# Patient Record
Sex: Female | Born: 1955
Health system: Southern US, Community
[De-identification: ages and names within clinical notes are randomized; demographics above are authoritative.]

## PROBLEM LIST (undated history)

## (undated) DIAGNOSIS — R0683 Snoring: Secondary | ICD-10-CM

## (undated) DIAGNOSIS — E785 Hyperlipidemia, unspecified: Secondary | ICD-10-CM

## (undated) DIAGNOSIS — Z9889 Other specified postprocedural states: Secondary | ICD-10-CM

## (undated) DIAGNOSIS — J189 Pneumonia, unspecified organism: Secondary | ICD-10-CM

## (undated) DIAGNOSIS — T7840XA Allergy, unspecified, initial encounter: Secondary | ICD-10-CM

## (undated) DIAGNOSIS — I10 Essential (primary) hypertension: Secondary | ICD-10-CM

## (undated) DIAGNOSIS — K219 Gastro-esophageal reflux disease without esophagitis: Secondary | ICD-10-CM

## (undated) HISTORY — DX: Hyperlipidemia, unspecified: E78.5

## (undated) HISTORY — DX: Gastro-esophageal reflux disease without esophagitis: K21.9

## (undated) HISTORY — DX: Allergy, unspecified, initial encounter: T78.40XA

## (undated) HISTORY — PX: AUGMENTATION MAMMAPLASTY: SUR837

## (undated) HISTORY — DX: Other specified postprocedural states: Z98.890

## (undated) HISTORY — PX: BREAST SURGERY: SHX581

## (undated) HISTORY — PX: COLONOSCOPY: SHX174

---

## 1978-02-08 HISTORY — PX: NASAL SINUS SURGERY: SHX719

## 1997-02-08 HISTORY — PX: ABDOMINAL HYSTERECTOMY: SHX81

## 1997-04-06 ENCOUNTER — Ambulatory Visit (HOSPITAL_COMMUNITY): Admission: RE | Admit: 1997-04-06 | Discharge: 1997-04-06 | Payer: Self-pay | Admitting: Obstetrics & Gynecology

## 1999-08-18 ENCOUNTER — Other Ambulatory Visit: Admission: RE | Admit: 1999-08-18 | Discharge: 1999-08-18 | Payer: Self-pay | Admitting: Obstetrics & Gynecology

## 2000-10-03 ENCOUNTER — Other Ambulatory Visit: Admission: RE | Admit: 2000-10-03 | Discharge: 2000-10-03 | Payer: Self-pay | Admitting: Obstetrics & Gynecology

## 2001-10-04 ENCOUNTER — Other Ambulatory Visit: Admission: RE | Admit: 2001-10-04 | Discharge: 2001-10-04 | Payer: Self-pay | Admitting: Obstetrics & Gynecology

## 2002-06-21 ENCOUNTER — Ambulatory Visit (HOSPITAL_COMMUNITY): Admission: RE | Admit: 2002-06-21 | Discharge: 2002-06-21 | Payer: Self-pay | Admitting: Internal Medicine

## 2002-07-24 ENCOUNTER — Other Ambulatory Visit: Admission: RE | Admit: 2002-07-24 | Discharge: 2002-07-24 | Payer: Self-pay | Admitting: Obstetrics & Gynecology

## 2002-11-06 ENCOUNTER — Encounter: Payer: Self-pay | Admitting: Obstetrics & Gynecology

## 2002-11-06 ENCOUNTER — Ambulatory Visit (HOSPITAL_COMMUNITY): Admission: RE | Admit: 2002-11-06 | Discharge: 2002-11-06 | Payer: Self-pay | Admitting: Obstetrics & Gynecology

## 2003-07-05 ENCOUNTER — Other Ambulatory Visit: Admission: RE | Admit: 2003-07-05 | Discharge: 2003-07-05 | Payer: Self-pay | Admitting: Obstetrics & Gynecology

## 2004-07-14 ENCOUNTER — Other Ambulatory Visit: Admission: RE | Admit: 2004-07-14 | Discharge: 2004-07-14 | Payer: Self-pay | Admitting: Obstetrics & Gynecology

## 2010-10-14 ENCOUNTER — Encounter: Payer: Self-pay | Admitting: Family Medicine

## 2010-10-14 ENCOUNTER — Ambulatory Visit (INDEPENDENT_AMBULATORY_CARE_PROVIDER_SITE_OTHER): Payer: BC Managed Care – PPO | Admitting: Family Medicine

## 2010-10-14 DIAGNOSIS — E785 Hyperlipidemia, unspecified: Secondary | ICD-10-CM

## 2010-10-14 DIAGNOSIS — E119 Type 2 diabetes mellitus without complications: Secondary | ICD-10-CM

## 2010-10-14 DIAGNOSIS — I10 Essential (primary) hypertension: Secondary | ICD-10-CM

## 2010-10-14 LAB — BASIC METABOLIC PANEL
BUN: 18 mg/dL (ref 6–23)
Calcium: 9.5 mg/dL (ref 8.4–10.5)
Chloride: 104 mEq/L (ref 96–112)
Glucose, Bld: 106 mg/dL — ABNORMAL HIGH (ref 70–99)
Potassium: 4.8 mEq/L (ref 3.5–5.1)

## 2010-10-14 NOTE — Assessment & Plan Note (Signed)
BP is excellently controlled- on ACE for both BP and renal protection due to DM.  Asymptomatic.  No changes at this time.

## 2010-10-14 NOTE — Assessment & Plan Note (Signed)
Pt admits to not following ADA diet.  Is exercising.  Taking Metformin as directed.  Due for A1C- adjust meds prn.  Pt expressed understanding and is in agreement w/ plan.

## 2010-10-14 NOTE — Patient Instructions (Signed)
Follow up in 3 months to recheck diabetes and cholesterol We'll notify you of your lab results and make any changes if needed Call with any questions or concerns Welcome!  We're glad to have you!

## 2010-10-14 NOTE — Progress Notes (Signed)
  Subjective:    Patient ID: Sherry Gallagher, female    DOB: 12-Oct-1955, 55 y.o.   MRN: [id number]  HPI New to establish.  Previous MD- [place], [place]  DM- chronic problem, dx'd 3 yrs ago.  on Metformin.  Lost 50 lbs when dx'd but gained 33 lbs back.  Last A1C was 5/12- 6.1.  'i love carbs'.  Denies symptomatic lows.  Not currently following ADA diet but is exercising regularly.  HTN- chronic problem for pt, started at time of DM.  on benazepril- HCTZ.  Denies CP, SOB, HAs, visual changes, edema.  Cholesterol- chronic problem for pt, on Pravachol 40mg .  Last labs, 5/12- LDL 117, HDL 53.  Denies abd pain, N/V, myalgias.  Concerns about husband who will be establishing in October.  Wife worries about possible dementia onset.  Has dx of ADD, has never taken meds.  Has increased aggravation and frustration- some difficulty w/ memory.  Review of Systems For ROS see HPI     Objective:   Physical Exam  Constitutional: She is oriented to person, place, and time. She appears well-developed and well-nourished. No distress.  HENT:  Head: Normocephalic and atraumatic.  Eyes: Conjunctivae and EOM are normal. Pupils are equal, round, and reactive to light.  Neck: Normal range of motion. Neck supple. No thyromegaly present.  Cardiovascular: Normal rate, regular rhythm, normal heart sounds and intact distal pulses.   No murmur heard. Pulmonary/Chest: Effort normal and breath sounds normal. No respiratory distress.  Abdominal: Soft. She exhibits no distension. There is no tenderness.  Musculoskeletal: She exhibits no edema.  Lymphadenopathy:    She has no cervical adenopathy.  Neurological: She is alert and oriented to person, place, and time.  Skin: Skin is warm and dry.  Psychiatric: She has a normal mood and affect. Her behavior is normal.          Assessment & Plan:

## 2010-10-14 NOTE — Assessment & Plan Note (Signed)
LDL goal is <70 due to DM.  Check labs and adjust meds prn.

## 2010-10-15 ENCOUNTER — Telehealth: Payer: Self-pay

## 2010-10-15 NOTE — Telephone Encounter (Signed)
Message copied by Beverely Low on Thu Oct 15, 2010  8:47 AM ------      Message from: Sheliah Hatch      Created: Wed Oct 14, 2010  1:45 PM       A1C is 6.5 (this is higher than 6.1 in May).  No need for med changes but should try and follow low carb diet and get regular exercise.

## 2010-10-15 NOTE — Telephone Encounter (Signed)
Pt aware.

## 2010-10-20 ENCOUNTER — Ambulatory Visit: Payer: Self-pay | Admitting: Family Medicine

## 2010-11-19 ENCOUNTER — Ambulatory Visit: Payer: Self-pay | Admitting: Family Medicine

## 2010-12-23 ENCOUNTER — Other Ambulatory Visit: Payer: Self-pay | Admitting: Family Medicine

## 2010-12-23 MED ORDER — METFORMIN HCL ER (MOD) 500 MG PO TB24: 1000.0000 mg | ORAL_TABLET | Freq: Every day | ORAL | Status: DC

## 2010-12-23 NOTE — Telephone Encounter (Signed)
Pt aware Of Dr Beverely Low comments.

## 2010-12-23 NOTE — Telephone Encounter (Signed)
Last OV 10-14-10

## 2010-12-23 NOTE — Telephone Encounter (Signed)
rx sent to pharmacy by e-script  

## 2010-12-23 NOTE — Telephone Encounter (Signed)
Pt is due for f/u appt in December- will hold off on making med changes until we see what next A1C is.  Should continue to follow ADA diet and try and get regular exercise.

## 2011-01-19 ENCOUNTER — Other Ambulatory Visit: Payer: Self-pay | Admitting: *Deleted

## 2011-01-19 MED ORDER — GLUCOSE BLOOD VI STRP: ORAL_STRIP | Status: DC

## 2011-01-19 NOTE — Telephone Encounter (Signed)
rx sent to pharmacy by e-script  

## 2011-01-26 ENCOUNTER — Ambulatory Visit (INDEPENDENT_AMBULATORY_CARE_PROVIDER_SITE_OTHER): Payer: BC Managed Care – PPO | Admitting: Internal Medicine

## 2011-01-26 DIAGNOSIS — J4540 Moderate persistent asthma, uncomplicated: Secondary | ICD-10-CM | POA: Insufficient documentation

## 2011-01-26 DIAGNOSIS — J45909 Unspecified asthma, uncomplicated: Secondary | ICD-10-CM

## 2011-01-26 DIAGNOSIS — E119 Type 2 diabetes mellitus without complications: Secondary | ICD-10-CM

## 2011-01-26 LAB — GLUCOSE, POCT (MANUAL RESULT ENTRY): POC Glucose: 373

## 2011-01-26 MED ORDER — HYDROCODONE-HOMATROPINE 5-1.5 MG/5ML PO SYRP: 5.0000 mL | ORAL_SOLUTION | Freq: Four times a day (QID) | ORAL | Status: AC | PRN

## 2011-01-26 MED ORDER — DOXYCYCLINE HYCLATE 100 MG PO TABS: 100.0000 mg | ORAL_TABLET | Freq: Two times a day (BID) | ORAL | Status: DC

## 2011-01-26 NOTE — Patient Instructions (Signed)
Get the XR in the morning Stop prednisone Rest, fluids , tylenol For cough, take Mucinex DM twice a day as needed  If cough continue, use hydrocodone (liquid) For wheezing: albuterol 4 times a day x 2 days, then as needed Take the antibiotic as prescribed ----> doxycycline Call if no better in few days Call anytime if the symptoms are severe

## 2011-01-26 NOTE — Progress Notes (Signed)
  Subjective:    Patient ID: Sherry Gallagher, female    DOB: [birth year], 55 y.o.   MRN: [id number]  HPI Sick x 1 week: Cough, wheezing, in general not feeling well; started prednisone 40x2 days, then 30x2 days, now at 20 mg a day---> that helped the wheezing to some extent. Started to have nocturnal fever 5 days ago, still coughing, still using his albuterol inh and nebulizer  Past Medical History  Diagnosis Date  . Diabetes mellitus   . Asthma   . GERD (gastroesophageal reflux disease)   . Allergy      Review of Systems No  N V D no rash No CP SOB No ST Has not checked her CBGs since she started steroids     Objective:   Physical Exam  Constitutional: She is oriented to person, place, and time. She appears well-developed and well-nourished.  HENT:  Head: Normocephalic and atraumatic.  Right Ear: External ear normal.  Left Ear: External ear normal.  Nose: Nose normal.  Mouth/Throat: No oropharyngeal exudate.  Cardiovascular:       HR ~ 100, no murmur   Pulmonary/Chest:       No increase WOB, few ronchi, end exp wheezing  Musculoskeletal: She exhibits no edema.  Neurological: She is alert and oriented to person, place, and time.       Assessment & Plan:

## 2011-01-26 NOTE — Assessment & Plan Note (Signed)
Pt w/ DM and asthma going through a exhacerbation, CBG today 373 (on prednisone 20 mg a day at this point) Plan: D/c steroids CXR abx See instructions  If prednisone needed in the next few days, consider Rx it along w/ amaryl

## 2011-01-27 ENCOUNTER — Telehealth: Payer: Self-pay | Admitting: *Deleted

## 2011-01-27 ENCOUNTER — Ambulatory Visit (INDEPENDENT_AMBULATORY_CARE_PROVIDER_SITE_OTHER)
Admission: RE | Admit: 2011-01-27 | Discharge: 2011-01-27 | Disposition: A | Discharge status: Home / Self Care | Payer: BC Managed Care – PPO | Source: Ambulatory Visit | Attending: Internal Medicine | Admitting: Internal Medicine

## 2011-01-27 ENCOUNTER — Encounter: Payer: Self-pay | Admitting: Internal Medicine

## 2011-01-27 DIAGNOSIS — J45909 Unspecified asthma, uncomplicated: Secondary | ICD-10-CM

## 2011-01-27 MED ORDER — MOXIFLOXACIN HCL 400 MG PO TABS: 400.0000 mg | ORAL_TABLET | Freq: Every day | ORAL | Status: AC

## 2011-01-27 NOTE — Telephone Encounter (Signed)
Message copied by Verdene Rio on Wed Jan 27, 2011  9:24 AM ------      Message from: Willow Ora E      Created: Wed Jan 27, 2011  7:00 AM       Open a phone note      Check on her, asthma better? CBGs better ?

## 2011-01-27 NOTE — Telephone Encounter (Signed)
Advise patient: I just saw the x-ray report, she does have pneumonia. Doxycycline is a good antibiotic but I prefer to switch to  Avelox 400 mg one daily for one week. I sent a new prescription, recommend to start Avelox today. ER if symptoms severe, also please followup with PCP in 3 weeks

## 2011-01-27 NOTE — Telephone Encounter (Signed)
Pt states that BS was 197 at 4 am and 217 at 8 am. Pt indicated that asthma is fine no SOB or difficulty with breathing Pt notes that she is on her way to get x-ray done.

## 2011-01-27 NOTE — Telephone Encounter (Signed)
Discuss with patient  

## 2011-01-28 ENCOUNTER — Telehealth: Payer: Self-pay | Admitting: *Deleted

## 2011-01-28 NOTE — Telephone Encounter (Signed)
Pt called to report that BS are still running high. Fasting today BS was 173 and having been running in low 200 all day. Pt denies any symptoms. Pt is just concern because BS are still high and are not return to her normal range of fasting at 130.Marland KitchenPlease advise

## 2011-01-29 ENCOUNTER — Encounter: Payer: Self-pay | Admitting: *Deleted

## 2011-01-29 NOTE — Telephone Encounter (Signed)
Patients with diabetes and infections such as pneumonia tend to run higher sugars. For a few days, in addition to her to metformin in the morning, she needs to take one  with dinner. Call with CBGs next week. ER it CBGs more than 250 consistently, also  watch diet

## 2011-01-29 NOTE — Telephone Encounter (Signed)
ok 

## 2011-01-29 NOTE — Telephone Encounter (Signed)
Pt needs work excuse for following dates 01-25-11 until 01-28-11 and Pt to return to work on 01-29-11. Pt would like letter mailed to her. .Please advise Ok to provide work excuse.   Discuss with patient will call with reading next weeks and ED if still elevated consistently.

## 2011-01-29 NOTE — Telephone Encounter (Signed)
Left message to call office

## 2011-01-29 NOTE — Telephone Encounter (Signed)
Letter Mail

## 2011-02-03 ENCOUNTER — Telehealth: Payer: Self-pay | Admitting: *Deleted

## 2011-02-03 NOTE — Telephone Encounter (Signed)
Pt took last ABX for pneumonia [seen on 12.18.12] and is still feeling like "she's not bouncing back". Upon speaking w/Pt reported that she has not used her Hycodan for cough--informed pt to use the Hycodan for cough, and continue w/Advair & albuterol nebulizer for bronchial & respiratory issues while recovering. Pt will call if symptoms do not improve in 2-3 days and/or if fever, trouble breathing begin.

## 2011-02-12 ENCOUNTER — Other Ambulatory Visit (INDEPENDENT_AMBULATORY_CARE_PROVIDER_SITE_OTHER): Payer: BC Managed Care – PPO

## 2011-02-12 DIAGNOSIS — I10 Essential (primary) hypertension: Secondary | ICD-10-CM

## 2011-02-12 DIAGNOSIS — E785 Hyperlipidemia, unspecified: Secondary | ICD-10-CM

## 2011-02-12 DIAGNOSIS — E119 Type 2 diabetes mellitus without complications: Secondary | ICD-10-CM

## 2011-02-12 LAB — HEMOGLOBIN A1C: Hgb A1c MFr Bld: 7.4 % — ABNORMAL HIGH (ref 4.6–6.5)

## 2011-02-12 LAB — LIPID PANEL
Cholesterol: 198 mg/dL (ref 0–200)
VLDL: 31.6 mg/dL (ref 0.0–40.0)

## 2011-02-12 LAB — BASIC METABOLIC PANEL
BUN: 19 mg/dL (ref 6–23)
Chloride: 105 mEq/L (ref 96–112)
GFR: 71.71 mL/min (ref >60.00)
Glucose, Bld: 156 mg/dL — ABNORMAL HIGH (ref 70–99)
Potassium: 4.9 mEq/L (ref 3.5–5.1)
Sodium: 140 mEq/L (ref 135–145)

## 2011-02-15 ENCOUNTER — Other Ambulatory Visit: Payer: Self-pay

## 2011-02-15 MED ORDER — PRAVASTATIN SODIUM 40 MG PO TABS: 40.0000 mg | ORAL_TABLET | Freq: Every day | ORAL | Status: DC

## 2011-02-15 NOTE — Telephone Encounter (Signed)
rx sent to pharmacy by e-script  

## 2011-02-15 NOTE — Telephone Encounter (Signed)
Ok for 6 months of meds

## 2011-02-15 NOTE — Telephone Encounter (Signed)
Pt is requesting a refill on Pravastin 40 mg. Pls send to [facility] on [address].

## 2011-02-18 ENCOUNTER — Encounter: Payer: Self-pay | Admitting: Family Medicine

## 2011-02-18 ENCOUNTER — Ambulatory Visit (INDEPENDENT_AMBULATORY_CARE_PROVIDER_SITE_OTHER): Payer: BC Managed Care – PPO | Admitting: Family Medicine

## 2011-02-18 DIAGNOSIS — E119 Type 2 diabetes mellitus without complications: Secondary | ICD-10-CM

## 2011-02-18 MED ORDER — TRETINOIN 0.1 % EX CREA: TOPICAL_CREAM | Freq: Every day | CUTANEOUS | Status: AC

## 2011-02-18 MED ORDER — METFORMIN HCL ER (OSM) 1000 MG PO TB24: 1000.0000 mg | ORAL_TABLET | Freq: Every day | ORAL | Status: DC

## 2011-02-18 NOTE — Patient Instructions (Signed)
Call me in 1 week and let me know how the sugars are doing Continue to check your fasting and your evening sugars If the sugars are still high after 1 week- start the Januvia daily We'll figure out follow up once we talk in a week Don't beat yourself up! Call with any questions or concerns Hang in there!!!

## 2011-02-18 NOTE — Progress Notes (Signed)
  Subjective:    Patient ID: Sherry Gallagher, female    DOB: 11-29-1955, 56 y.o.   MRN: [id number]  HPI DM- recent A1C shows worsening A1C control (7.4) started steroids that previous MD had given her in [facility] and did not realize that it would impact CBGs.  CBG was elevated to 300s here in December.  Reports CBGs have slowly come down under 200.  Fasting CBGs remain 150-160 where previously it was consistently in 120-130s.  Denies CP, SOB, HAs, visual changes, edema.   Review of Systems For ROS see HPI     Objective:   Physical Exam  Constitutional: She is oriented to person, place, and time. She appears well-developed and well-nourished. No distress.  HENT:  Head: Normocephalic and atraumatic.  Eyes: Conjunctivae and EOM are normal. Pupils are equal, round, and reactive to light.  Neck: Normal range of motion. Neck supple. No thyromegaly present.  Cardiovascular: Normal rate, regular rhythm, normal heart sounds and intact distal pulses.   No murmur heard. Pulmonary/Chest: Effort normal and breath sounds normal. No respiratory distress.  Abdominal: Soft. She exhibits no distension. There is no tenderness.  Musculoskeletal: She exhibits no edema.  Lymphadenopathy:    She has no cervical adenopathy.  Neurological: She is alert and oriented to person, place, and time.  Skin: Skin is warm and dry.  Psychiatric: She has a normal mood and affect. Her behavior is normal.          Assessment & Plan:

## 2011-02-18 NOTE — Assessment & Plan Note (Signed)
Deteriorated.  Likely due to pt's recent illness and prednisone use.  Pt feels pharmacy switching her Metformin to Glumetza has effected her level on control.  Will switch med.  If sugars don't improve w/ changing med, pt to add Januvia after 1 week.  Will follow closely.

## 2011-02-22 ENCOUNTER — Telehealth: Payer: Self-pay | Admitting: *Deleted

## 2011-02-22 ENCOUNTER — Emergency Department (INDEPENDENT_AMBULATORY_CARE_PROVIDER_SITE_OTHER): Payer: BC Managed Care – PPO

## 2011-02-22 ENCOUNTER — Encounter (HOSPITAL_BASED_OUTPATIENT_CLINIC_OR_DEPARTMENT_OTHER): Payer: Self-pay | Admitting: Family Medicine

## 2011-02-22 ENCOUNTER — Other Ambulatory Visit: Payer: Self-pay

## 2011-02-22 ENCOUNTER — Emergency Department (HOSPITAL_BASED_OUTPATIENT_CLINIC_OR_DEPARTMENT_OTHER)
Admission: EM | Admit: 2011-02-22 | Discharge: 2011-02-22 | Disposition: A | Discharge status: Home / Self Care | Payer: BC Managed Care – PPO | Attending: Emergency Medicine | Admitting: Emergency Medicine

## 2011-02-22 DIAGNOSIS — R079 Chest pain, unspecified: Secondary | ICD-10-CM | POA: Insufficient documentation

## 2011-02-22 DIAGNOSIS — J45909 Unspecified asthma, uncomplicated: Secondary | ICD-10-CM

## 2011-02-22 DIAGNOSIS — J45901 Unspecified asthma with (acute) exacerbation: Secondary | ICD-10-CM | POA: Insufficient documentation

## 2011-02-22 DIAGNOSIS — K219 Gastro-esophageal reflux disease without esophagitis: Secondary | ICD-10-CM | POA: Insufficient documentation

## 2011-02-22 DIAGNOSIS — J9819 Other pulmonary collapse: Secondary | ICD-10-CM

## 2011-02-22 DIAGNOSIS — R0602 Shortness of breath: Secondary | ICD-10-CM | POA: Insufficient documentation

## 2011-02-22 DIAGNOSIS — Z79899 Other long term (current) drug therapy: Secondary | ICD-10-CM | POA: Insufficient documentation

## 2011-02-22 DIAGNOSIS — E119 Type 2 diabetes mellitus without complications: Secondary | ICD-10-CM | POA: Insufficient documentation

## 2011-02-22 HISTORY — DX: Pneumonia, unspecified organism: J18.9

## 2011-02-22 LAB — CBC
Hemoglobin: 14.1 g/dL (ref 12.0–15.0)
MCH: 30.2 pg (ref 26.0–34.0)
MCV: 91.2 fL (ref 78.0–100.0)
Platelets: 366 10*3/uL (ref 150–400)
RBC: 4.67 MIL/uL (ref 3.87–5.11)

## 2011-02-22 LAB — BASIC METABOLIC PANEL
CO2: 25 mEq/L (ref 19–32)
Calcium: 10.3 mg/dL (ref 8.4–10.5)
Creatinine, Ser: 0.6 mg/dL (ref 0.50–1.10)
Glucose, Bld: 132 mg/dL — ABNORMAL HIGH (ref 70–99)

## 2011-02-22 MED ORDER — ALBUTEROL SULFATE HFA 108 (90 BASE) MCG/ACT IN AERS
2.0000 | INHALATION_SPRAY | Freq: Once | RESPIRATORY_TRACT | Status: AC
  Administered 2011-02-22: 2 via RESPIRATORY_TRACT
  Filled 2011-02-22: qty 6.7

## 2011-02-22 MED ORDER — IPRATROPIUM BROMIDE 0.02 % IN SOLN
0.5000 mg | Freq: Once | RESPIRATORY_TRACT | Status: AC
  Administered 2011-02-22: 0.5 mg via RESPIRATORY_TRACT
  Filled 2011-02-22: qty 2.5

## 2011-02-22 MED ORDER — ALBUTEROL SULFATE (5 MG/ML) 0.5% IN NEBU
5.0000 mg | INHALATION_SOLUTION | Freq: Once | RESPIRATORY_TRACT | Status: AC
  Administered 2011-02-22: 5 mg via RESPIRATORY_TRACT
  Filled 2011-02-22: qty 1

## 2011-02-22 MED ORDER — AEROCHAMBER PLUS W/MASK LARGE MISC
1.0000 | Freq: Once | Status: DC
  Filled 2011-02-22: qty 1

## 2011-02-22 MED ORDER — ASPIRIN 81 MG PO CHEW
324.0000 mg | CHEWABLE_TABLET | Freq: Once | ORAL | Status: AC
  Administered 2011-02-22: 324 mg via ORAL
  Filled 2011-02-22: qty 4

## 2011-02-22 NOTE — ED Provider Notes (Signed)
History  This chart was scribed for Sherry Numbers, MD by Bennett Scrape. This patient was seen in room MH12/MH12 and the patient's care was started at 3:23PM.  CSN: [id number]  Arrival date & time 02/22/11  1347   First MD Initiated Contact with Patient 02/22/11 1521      Chief Complaint  Patient presents with  . Shortness of Breath   Patient is a 56 y.o. female presenting with shortness of breath. The history is provided by the patient. No language interpreter was used.  Shortness of Breath  The current episode started 3 to 5 days ago. The onset was gradual. The problem occurs frequently. The problem has been unchanged. The symptoms are relieved by nothing. The symptoms are aggravated by nothing. Associated symptoms include shortness of breath. Pertinent negatives include no chest pain, no fever, no rhinorrhea, no sore throat, no cough and no wheezing. Her past medical history is significant for asthma.    Sherry Gallagher is a 56 y.o. female who presents to the Emergency Department complaining of 3 days of gradual onset, non-changing, intermittent SOB with associated chest discomfort described as mild soreness that started while she was on her computer at home. She describes her SOB as taking short breaths with an occasional deeper breath every few minutes. She denies any modifying factors and states that the symptoms are non-exertional. Pt has a h/o asthma and reports that she has been taking advair and albuterol with no improvement in symptoms. She denies being on hormones currently.  She denies fever, myalgias, and wheezing as associated symptoms. Pt states that she called her PCP today and was told to come to the ED for a scan. She states she has been able to exercise and do normal tasks. She reports that she took 9 days worth of prednisone between December 8th, 2012 and December 17th, 2012 and stopped taking her blood sugar medication. She states that she quit taking the prednisone after  a check-up with her PCP on December 17th, 2012. She reports that her CBG level was 363 and that she was diagnosed with pneumonia through a chest x-ray. She reports that her CBG has returned to normal between 120-140. She reports that it was 174 this morning. She has not gotten a flu shot this year.  She states that she has been experiencing a lot of stress lately from starting a new job. She denies smoking and alcohol use.  Patient has no history of PE or DVT and has been on no recent long trips. She's had a recent embolization either. She denies any calf swelling or tenderness.  Pt's PCP is Dr. Beverely Low.    Past Medical History  Diagnosis Date  . Diabetes mellitus   . Asthma   . GERD (gastroesophageal reflux disease)   . Allergy   . Pneumonia     Past Surgical History  Procedure Date  . Abdominal hysterectomy 1999    Family History  Problem Relation Age of Onset  . Alcohol abuse Father   . Cancer Maternal Grandmother     breast  . Cancer Paternal Grandmother     breast    History  Substance Use Topics  . Smoking status: Never Smoker   . Smokeless tobacco: Not on file  . Alcohol Use: No    OB History    Grav Para Term Preterm Abortions TAB SAB Ect Mult Living                  Review of  Systems  Constitutional: Negative for fever and chills.  HENT: Negative for sore throat, rhinorrhea and neck pain.   Eyes: Negative for itching.  Respiratory: Positive for shortness of breath. Negative for cough and wheezing.   Cardiovascular: Negative for chest pain.  Gastrointestinal: Negative for nausea, vomiting, abdominal pain and diarrhea.  Genitourinary: Negative for dysuria, urgency and hematuria.  Musculoskeletal: Negative for back pain.  Skin: Negative for rash.  Neurological: Negative for weakness and headaches.    Allergies  Penicillins  Home Medications   Current Outpatient Rx  Name Route Sig Dispense Refill  . ALBUTEROL SULFATE (2.5 MG/3ML) 0.083% IN NEBU  Nebulization Take 2.5 mg by nebulization daily as needed.      Marland Kitchen BENAZEPRIL-HYDROCHLOROTHIAZIDE 20-25 MG PO TABS Oral Take 0.5 tablets by mouth daily.      Marland Kitchen CETIRIZINE HCL 10 MG PO TABS Oral Take 10 mg by mouth daily. Every other day     . ESOMEPRAZOLE MAGNESIUM 40 MG PO CPDR Oral Take 40 mg by mouth. 1 tab every other day     . FLUTICASONE-SALMETEROL 250-50 MCG/DOSE IN AEPB Inhalation Inhale into the lungs every 12 (twelve) hours.      Marland Kitchen GLUCOSE BLOOD VI STRP  Use as instructed 100 each 12  . METFORMIN HCL ER (OSM) 1000 MG PO TB24 Oral Take 1 tablet (1,000 mg total) by mouth daily with breakfast. 90 tablet 3  . PRAVASTATIN SODIUM 40 MG PO TABS Oral Take 1 tablet (40 mg total) by mouth daily. 30 tablet 6  . TRETINOIN 0.1 % EX CREA Topical Apply topically at bedtime. 45 g 3    Triage Vitals: BP 142/79  Pulse 98  Temp(Src) 98 F (36.7 C) (Oral)  Resp 16  Ht 5' 6.25" (1.683 m)  Wt 233 lb (105.688 kg)  BMI 37.32 kg/m2  SpO2 100%  Physical Exam  Nursing note and vitals reviewed. Constitutional: She is oriented to person, place, and time. She appears well-developed and well-nourished.  HENT:  Head: Normocephalic and atraumatic.  Eyes: Conjunctivae and EOM are normal.  Neck: Normal range of motion. Neck supple.  Cardiovascular: Normal rate, regular rhythm and normal heart sounds.   Pulmonary/Chest: Effort normal. No respiratory distress.       Diminished throughout  Abdominal: Soft. There is no tenderness.  Musculoskeletal: Normal range of motion. She exhibits no edema.  Neurological: She is alert and oriented to person, place, and time. No cranial nerve deficit.  Skin: Skin is warm and dry. No rash noted.  Psychiatric: She has a normal mood and affect. Her behavior is normal.    ED Course  Procedures (including critical care time)  DIAGNOSTIC STUDIES: Oxygen Saturation is 99% on room air, normal by my interpretation.    COORDINATION OF CARE: 3:30PM-Discussed finger stick,  chest x-ray, EKG, CBC, troponin and breathing treatment with pt at bedside and pt agreed to plan.  Labs Reviewed  BASIC METABOLIC PANEL - Abnormal; Notable for the following:    Glucose, Bld 132 (*)    All other components within normal limits  PRO B NATRIURETIC PEPTIDE  CBC  TROPONIN I  TROPONIN I   Dg Chest 2 View  02/22/2011  *RADIOLOGY REPORT*  Clinical Data: Shortness of breath, asthma.  CHEST - 2 VIEW  Comparison: 01/27/2011  Findings: Minimal bibasilar atelectasis.  Heart is normal size.  No effusions.  No acute bony abnormality.  Degenerative changes in the thoracic spine.  IMPRESSION: Bibasilar atelectasis.  Original Report Authenticated By: Cyndie Chime,  M.D.    Date: 02/22/2011  Rate: 106  Rhythm: sinus tachycardia  QRS Axis: normal  Intervals: normal  ST/T Wave abnormalities: nonspecific ST changes  Conduction Disutrbances:none  Narrative Interpretation:   Old EKG Reviewed: none available    1. Asthma exacerbation   2. Chest pain       MDM  Given patient's presentation I had no concerns for possible pulmonary embolus. She had no risk factors for this. Patient did have history of diabetes and did describe a slight chest tightness. EKG showed no acute ischemic changes. Patient had negative chest x-ray as well as negative enzymes at 0 and 3 hours. CBC and BMP were also unrevealing. Patient was treated with albuterol and Atrovent. With this patient had improvement in her symptoms. Breath sounds were also improved. Patient was given an albuterol inhaler as well as a spacer.  I discussed results with patient. Patient was offered prednisone but declined this given her problems with her blood sugar this in the past. Patient was discharged in good condition. She was told to followup with her regular Dr. as needed.   I personally performed the services described in this documentation, which was scribed in my presence. The recorded information has been reviewed and  considered.        Sherry Numbers, MD 02/22/11 2350

## 2011-02-22 NOTE — Telephone Encounter (Signed)
If having short shallow breaths and chest tightness needs to go to UC or ER for evaluation.  This could be blood clot or other serious condition.

## 2011-02-22 NOTE — ED Notes (Signed)
Pt c/o intermittent shortness of breath since Friday. Pt sts she was told by Dr. Beverely Low to come for scan. Pt sts she was able to exercise yesterday and do normal tasks. Pt denies pain or wheezing. 99% oxygen sats, pt able to speak in compete sentences. nad noted.

## 2011-02-22 NOTE — Telephone Encounter (Signed)
Pt reports that she seen Dr Drue Novel in 12.2012 w/Pneumonia and OV last week w/you [using nebulizer]; Pt c/o taking short, shallow breaths since Saturday morning w/o chest tightness. Pt would like to know if you have recommendations or does she need OV.?

## 2011-02-22 NOTE — Telephone Encounter (Signed)
Called pt to provide instructions, pt advised this is not a constant issue, spoke to MD Tabori and advised pt that MD Beverely Low still wants her to see UC or ER based on these symptoms, she stated she may go to the one on [address], advised she could also go to any ER in surrounding area or UC, pt understood and is planning on going to ER

## 2011-02-22 NOTE — ED Notes (Signed)
Dr. Hunt at bedside.

## 2011-02-26 ENCOUNTER — Telehealth: Payer: Self-pay | Admitting: *Deleted

## 2011-02-26 NOTE — Telephone Encounter (Signed)
Fasting Am BS ranging 140-170. BS today 174 Pt indicated that she did go ahead this am and started the [place].

## 2011-02-26 NOTE — Telephone Encounter (Signed)
Agree w/ starting Januvia

## 2011-02-26 NOTE — Telephone Encounter (Signed)
Discuss with patient  

## 2011-03-04 ENCOUNTER — Ambulatory Visit (HOSPITAL_BASED_OUTPATIENT_CLINIC_OR_DEPARTMENT_OTHER)
Admission: RE | Admit: 2011-03-04 | Discharge: 2011-03-04 | Disposition: A | Discharge status: Home / Self Care | Payer: BC Managed Care – PPO | Source: Ambulatory Visit | Attending: Family Medicine | Admitting: Family Medicine

## 2011-03-04 ENCOUNTER — Encounter: Payer: Self-pay | Admitting: Family Medicine

## 2011-03-04 ENCOUNTER — Ambulatory Visit (INDEPENDENT_AMBULATORY_CARE_PROVIDER_SITE_OTHER): Payer: BC Managed Care – PPO | Admitting: Family Medicine

## 2011-03-04 VITALS — BP 122/76 | HR 100 | Temp 97.9°F | Wt 233.0 lb

## 2011-03-04 DIAGNOSIS — M7730 Calcaneal spur, unspecified foot: Secondary | ICD-10-CM

## 2011-03-04 DIAGNOSIS — M79609 Pain in unspecified limb: Secondary | ICD-10-CM

## 2011-03-04 DIAGNOSIS — R937 Abnormal findings on diagnostic imaging of other parts of musculoskeletal system: Secondary | ICD-10-CM

## 2011-03-04 DIAGNOSIS — M79672 Pain in left foot: Secondary | ICD-10-CM

## 2011-03-04 NOTE — Patient Instructions (Signed)
Stress Fracture When too much stress is put on the foot, as in running and jumping sports, the center shaft of the bones of the forefoot is very susceptible to stress fractures (break in bones) because of thinness of this bone. This injury is more common if osteoporosis is present or if inadequate running shoes are used. Shoes should be used which adequately cushion the foot to absorb the shocks of the activity participated in. Stress fractures are very common in competitive female runners who develop these small cracks on the surface of the bones in their legs and feet. The women most likely to suffer these injuries are those who restrict food and those who have irregular periods. Stress fractures usually start out as a minor discomfort in the foot or leg. The fracture often occurs near the end of a long run. Usually the pain goes away with rest. On the next day, the pain returns earlier in the run. If an athlete notices that it hurts to touch just one spot on a bone and then stops running for a week, they can return to running quickly. But usually the pain is ignored and a stress fracture develops. The athlete now has to avoid the hard pounding of running, but can ride a bike or swim for exercise until the fracture heals in 6 to 12 weeks. The most common sites for stress fractures are the bones in the front of the feet and the long bone of the lower leg, but running can cause stress fractures anywhere, even in the pelvic bones. DIAGNOSIS  Usually the diagnosis is made by history. The bone involved progressively becomes sorer with activities. X-rays may be negative (show no break) within the first two to three weeks of the beginning of pain. A later x-ray may show signs of healing bone (callus formation). A bone scan will usually make the diagnosis earlier. HOME CARE INSTRUCTIONS  Treatment may or may not include a cast or walking shoe. When casts are needed the use is usually for short periods of time so as  not to slow down healing with muscle wasting (atrophy).   Activities should be stopped until further advised by your caregiver.   Wear shoes with adequate shock absorbing abilities.   Alternative exercise may be undertaken while waiting for healing. These may include bicycling and swimming, or as your caregiver suggests.  If you do not have a cast or splint:  You may walk on your injured foot as tolerated or advised.   Do not put any weight on your injured foot until instructed. Slowly increase the amount of time you walk on the foot as the pain allows or as advised.   Use crutches until you can bear weight without pain. A gradual increase in weight bearing may help.   Apply ice to the injury for 15 to 20 minutes each hour while awake for the first 2 days. Put the ice in a plastic bag and place a towel between the bag of ice and your skin.   Only take over-the-counter or prescription medicines for pain, discomfort, or fever as directed by your caregiver.   If your caregiver has given you a follow-up appointment, it is very important to keep that appointment. Not keeping the appointment could result in a chronic or permanent injury, pain, and disability. If there is any problem keeping the appointment, you must call back to this facility for assistance.  SEEK IMMEDIATE MEDICAL CARE IF:   Pain is becoming worse rather than  better, or if pain is uncontrolled with medicine.   You have increased swelling or redness in the foot.  Document Released: 04/17/2002 Document Revised: 10/07/2010 Document Reviewed: 09/11/2007 [facility] Patient Information 2012 [place], [place]. Gout Gout is an inflammatory condition (arthritis) caused by a buildup of uric acid crystals in the joints. Uric acid is a chemical that is normally present in the blood. Under some circumstances, uric acid can form into crystals in your joints. This causes joint redness, soreness, and swelling (inflammation). Repeat attacks are  common. Over time, uric acid crystals can form into masses (tophi) near a joint, causing disfigurement. Gout is treatable and often preventable. CAUSES  The disease begins with elevated levels of uric acid in the blood. Uric acid is produced by your body when it breaks down a naturally found substance called purines. This also happens when you eat certain foods such as meats and fish. Causes of an elevated uric acid level include:  Being passed down from parent to child (heredity).   Diseases that cause increased uric acid production (obesity, psoriasis, some cancers).   Excessive alcohol use.   Diet, especially diets rich in meat and seafood.   Medicines, including certain cancer-fighting drugs (chemotherapy), diuretics, and aspirin.   Chronic kidney disease. The kidneys are no longer able to remove uric acid well.   Problems with metabolism.  Conditions strongly associated with gout include:  Obesity.   High blood pressure.   High cholesterol.   Diabetes.  Not everyone with elevated uric acid levels gets gout. It is not understood why some people get gout and others do not. Surgery, joint injury, and eating too much of certain foods are some of the factors that can lead to gout. SYMPTOMS   An attack of gout comes on quickly. It causes intense pain with redness, swelling, and warmth in a joint.   Fever can occur.   Often, only one joint is involved. Certain joints are more commonly involved:   Base of the big toe.   Knee.   Ankle.   Wrist.   Finger.  Without treatment, an attack usually goes away in a few days to weeks. Between attacks, you usually will not have symptoms, which is different from many other forms of arthritis. DIAGNOSIS  Your caregiver will suspect gout based on your symptoms and exam. Removal of fluid from the joint (arthrocentesis) is done to check for uric acid crystals. Your caregiver will give you a medicine that numbs the area (local anesthetic)  and use a needle to remove joint fluid for exam. Gout is confirmed when uric acid crystals are seen in joint fluid, using a special microscope. Sometimes, blood, urine, and X-ray tests are also used. TREATMENT  There are 2 phases to gout treatment: treating the sudden onset (acute) attack and preventing attacks (prophylaxis). Treatment of an Acute Attack  Medicines are used. These include anti-inflammatory medicines or steroid medicines.   An injection of steroid medicine into the affected joint is sometimes necessary.   The painful joint is rested. Movement can worsen the arthritis.   You may use warm or cold treatments on painful joints, depending which works best for you.   Discuss the use of coffee, vitamin C, or cherries with your caregiver. These may be helpful treatment options.  Treatment to Prevent Attacks After the acute attack subsides, your caregiver may advise prophylactic medicine. These medicines either help your kidneys eliminate uric acid from your body or decrease your uric acid production. You may need  to stay on these medicines for a very long time. The early phase of treatment with prophylactic medicine can be associated with an increase in acute gout attacks. For this reason, during the first few months of treatment, your caregiver may also advise you to take medicines usually used for acute gout treatment. Be sure you understand your caregiver's directions. You should also discuss dietary treatment with your caregiver. Certain foods such as meats and fish can increase uric acid levels. Other foods such as dairy can decrease levels. Your caregiver can give you a list of foods to avoid. HOME CARE INSTRUCTIONS   Do not take aspirin to relieve pain. This raises uric acid levels.   Only take over-the-counter or prescription medicines for pain, discomfort, or fever as directed by your caregiver.   Rest the joint as much as possible. When in bed, keep sheets and blankets off  painful areas.   Keep the affected joint raised (elevated).   Use crutches if the painful joint is in your leg.   Drink enough water and fluids to keep your urine clear or pale yellow. This helps your body get rid of uric acid. Do not drink alcoholic beverages. They slow the passage of uric acid.   Follow your caregiver's dietary instructions. Pay careful attention to the amount of protein you eat. Your daily diet should emphasize fruits, vegetables, whole grains, and fat-free or low-fat milk products.   Maintain a healthy body weight.  SEEK MEDICAL CARE IF:   You have an oral temperature above 102 F (38.9 C).   You develop diarrhea, vomiting, or any side effects from medicines.   You do not feel better in 24 hours, or you are getting worse.  SEEK IMMEDIATE MEDICAL CARE IF:   Your joint becomes suddenly more tender and you have:   Chills.   An oral temperature above 102 F (38.9 C), not controlled by medicine.  MAKE SURE YOU:   Understand these instructions.   Will watch your condition.   Will get help right away if you are not doing well or get worse.  Document Released: 01/23/2000 Document Revised: 10/07/2010 Document Reviewed: 05/05/2009 [facility] Patient Information 2012 [place], [place].

## 2011-03-04 NOTE — Progress Notes (Signed)
  Subjective:    Patient ID: Sherry Gallagher, female    DOB: 1956-01-20, 56 y.o.   MRN: [id number]  HPI Pt here c/o L foot pain that she woke up with this am.  Pt did eat red meat last night but she has no hx of gout or injury. Pt is unable to bend foot.   Review of Systems As above    Objective:   Physical Exam  Constitutional: She is oriented to person, place, and time. She appears well-developed and well-nourished.  Musculoskeletal:       L foot--+ tenderness over 1-3 metatarsal phlange.  + swelling and errythema Cool to touch Good pulses  Neurological: She is alert and oriented to person, place, and time.  Psychiatric: She has a normal mood and affect. Judgment and thought content normal.          Assessment & Plan:  L foot pain---  Check xray                       ? Stress fx vs gout                         Pt refused blood work today---she is just getting over pneumonia and felt like a pin cushion then                        Post op boot and NSAIDs

## 2011-03-08 ENCOUNTER — Other Ambulatory Visit: Payer: Self-pay | Admitting: *Deleted

## 2011-03-08 MED ORDER — ONETOUCH FINEPOINT LANCETS MISC: Status: DC

## 2011-03-08 NOTE — Telephone Encounter (Signed)
rx sent to pharmacy by e-script  

## 2011-03-16 ENCOUNTER — Ambulatory Visit (INDEPENDENT_AMBULATORY_CARE_PROVIDER_SITE_OTHER): Payer: BC Managed Care – PPO | Admitting: Family Medicine

## 2011-03-16 ENCOUNTER — Encounter: Payer: Self-pay | Admitting: Family Medicine

## 2011-03-16 VITALS — BP 140/80 | HR 100 | Temp 98.4°F | Ht 66.5 in | Wt 230.0 lb

## 2011-03-16 DIAGNOSIS — E119 Type 2 diabetes mellitus without complications: Secondary | ICD-10-CM

## 2011-03-16 DIAGNOSIS — R309 Painful micturition, unspecified: Secondary | ICD-10-CM

## 2011-03-16 DIAGNOSIS — R300 Dysuria: Secondary | ICD-10-CM

## 2011-03-16 LAB — POCT URINALYSIS DIPSTICK
Bilirubin, UA: NEGATIVE
Blood, UA: NEGATIVE
Glucose, UA: NEGATIVE
Spec Grav, UA: 1.02

## 2011-03-16 MED ORDER — SITAGLIPTIN PHOSPHATE 100 MG PO TABS: 100.0000 mg | ORAL_TABLET | Freq: Every day | ORAL | Status: DC

## 2011-03-16 NOTE — Patient Instructions (Signed)
Follow up in early March for your routine diabetes check Continue the Januvia daily We'll notify you of your urine results and start antibiotics as needed Call if your symptoms change or worsen Hang in there!!!

## 2011-03-16 NOTE — Progress Notes (Signed)
  Subjective:    Patient ID: Sherry Gallagher, female    DOB: 1955-11-19, 56 y.o.   MRN: [id number]  HPI Dysuria- reports sxs started on Friday.  Improved w/ increased water intake.  Was fine for 2 days and then returned on Sunday morning.  After drinking more water, sxs again improved.    DM- was started on Januvia 6 weeks ago and fasting CBGs have gone from 160-180 to 120-130.  Would like script.   Review of Systems For ROS see HPI     Objective:   Physical Exam  Vitals reviewed. Constitutional: She appears well-developed and well-nourished. No distress.  Abdominal: Soft. She exhibits no distension. There is no tenderness (no suprapubic or CVA tenderness).          Assessment & Plan:

## 2011-03-16 NOTE — Assessment & Plan Note (Signed)
Script given for Januvia along w/ coupon card.  Will continue to follow closely.

## 2011-03-16 NOTE — Assessment & Plan Note (Signed)
Pt's sxs consistent w/ UTI but UA w/out evidence of infxn.  Will send for cx and hold on abx until cx results available.  Pt to call if sxs change or worse.  Pt expressed understanding and is in agreement w/ plan.

## 2011-03-18 LAB — CULTURE, URINE COMPREHENSIVE: Organism ID, Bacteria: NO GROWTH

## 2011-04-15 ENCOUNTER — Other Ambulatory Visit: Payer: Self-pay

## 2011-04-15 MED ORDER — BENAZEPRIL-HYDROCHLOROTHIAZIDE 20-25 MG PO TABS: 0.5000 | ORAL_TABLET | Freq: Every day | ORAL | Status: DC

## 2011-04-15 NOTE — Telephone Encounter (Signed)
Patient usually gets this medication from a [facility] physician, but wanted to know if you would fill it for her now?

## 2011-05-12 ENCOUNTER — Telehealth: Payer: Self-pay | Admitting: Family Medicine

## 2011-05-12 NOTE — Telephone Encounter (Signed)
Please call pt to advise that she can come in for labs at 8:30am instead and set her up for a follow up apt at 9am, per MD Tabori, thank you

## 2011-05-12 NOTE — Telephone Encounter (Signed)
Patient called & would like to come in for a A1C check. I scheduled her for Friday at 8:15am, however if she needs to be seen first please let me know & I will call her back to schedule an appointment. Thanks  Last OV 2.5.13

## 2011-05-13 NOTE — Telephone Encounter (Signed)
Lab for 830 has been filled left her at 815, called & LMOM to come in for both labs & follow up with Dt. Beverely Low

## 2011-05-14 ENCOUNTER — Encounter: Payer: Self-pay | Admitting: Family Medicine

## 2011-05-14 ENCOUNTER — Ambulatory Visit (INDEPENDENT_AMBULATORY_CARE_PROVIDER_SITE_OTHER): Payer: BC Managed Care – PPO | Admitting: Family Medicine

## 2011-05-14 ENCOUNTER — Other Ambulatory Visit (INDEPENDENT_AMBULATORY_CARE_PROVIDER_SITE_OTHER): Payer: BC Managed Care – PPO

## 2011-05-14 VITALS — BP 135/75 | HR 93 | Temp 98.5°F | Ht 66.5 in | Wt 222.6 lb

## 2011-05-14 DIAGNOSIS — E119 Type 2 diabetes mellitus without complications: Secondary | ICD-10-CM

## 2011-05-14 LAB — HEMOGLOBIN A1C: Hgb A1c MFr Bld: 6.3 % (ref 4.6–6.5)

## 2011-05-14 NOTE — Assessment & Plan Note (Signed)
Chronic problem.  Pt has changed diet and started exercising in attempts to lose weight.  Applauded these efforts.  Check labs.  Pt would like to stop Januvia due to possible pancreatic cancer.  Discussed starting Victoza- pt aware of black box warning.  Pt wants to hold off on starting and see what A1C is and the decide on next steps.  Pt education materials on Victoza provided.

## 2011-05-14 NOTE — Patient Instructions (Signed)
Schedule your complete physical in 3 months- don't eat before this We'll decide on what to do based on A1C results If they look good- we'll stop Januvia Call w/ any questions or concerns Happy Spring!!!

## 2011-05-14 NOTE — Progress Notes (Signed)
  Subjective:    Patient ID: Sherry Gallagher, female    DOB: 1955/03/15, 56 y.o.   MRN: [id number]  HPI DM- chronic problem, on Metformin and Januvia.  Fasting CBGs ranging 130-160s.  Has lost 8 lbs since last visit.  Is watching diet and exercising regularly.  Is upset b/c of the recent Januvia ads about pancreatic cancer.  Denies symptomatic lows.  No dizziness, CP, SOB, HAs, visual changes, edema.   Review of Systems For ROS see HPI     Objective:   Physical Exam  Vitals reviewed. Constitutional: She is oriented to person, place, and time. She appears well-developed and well-nourished. No distress.  HENT:  Head: Normocephalic and atraumatic.  Eyes: Conjunctivae and EOM are normal. Pupils are equal, round, and reactive to light.  Neck: Normal range of motion. Neck supple. No thyromegaly present.  Cardiovascular: Normal rate, regular rhythm, normal heart sounds and intact distal pulses.   No murmur heard. Pulmonary/Chest: Effort normal and breath sounds normal. No respiratory distress.  Abdominal: Soft. She exhibits no distension. There is no tenderness.  Musculoskeletal: She exhibits no edema.  Lymphadenopathy:    She has no cervical adenopathy.  Neurological: She is alert and oriented to person, place, and time.  Skin: Skin is warm and dry.  Psychiatric: She has a normal mood and affect. Her behavior is normal.          Assessment & Plan:

## 2011-05-21 ENCOUNTER — Other Ambulatory Visit: Payer: Self-pay | Admitting: *Deleted

## 2011-05-21 MED ORDER — ESOMEPRAZOLE MAGNESIUM 40 MG PO CPDR: 40.0000 mg | DELAYED_RELEASE_CAPSULE | ORAL | Status: DC

## 2011-05-21 NOTE — Telephone Encounter (Signed)
Nexium 40 Take [1] every other day - 90 day supply requested Done.

## 2011-05-24 ENCOUNTER — Other Ambulatory Visit: Payer: Self-pay | Admitting: *Deleted

## 2011-05-24 NOTE — Telephone Encounter (Signed)
Pt states that Rx was sent in #15 Pt would like to get #30 sent in because she states that even though med sig is every other day she sometime takes med's daily..Please advise

## 2011-05-24 NOTE — Telephone Encounter (Signed)
Ok for #30 but will need to change sig to 1 tab daily

## 2011-05-25 ENCOUNTER — Other Ambulatory Visit: Payer: Self-pay | Admitting: *Deleted

## 2011-05-25 MED ORDER — ESOMEPRAZOLE MAGNESIUM 40 MG PO CPDR: 40.0000 mg | DELAYED_RELEASE_CAPSULE | Freq: Every day | ORAL | Status: DC

## 2011-05-25 NOTE — Telephone Encounter (Signed)
Received call from Avilin from [facility] to clarify that pt needs to take Nexium 40mg  daily instead of Every Other Day as sig notes, advised verbal order to change the sig to say one tablet by mouth daily for Nexium 40mg , noted and changed in system as phone in order, [facility] rep understood order with #30 and 5 refills

## 2011-06-17 ENCOUNTER — Ambulatory Visit (INDEPENDENT_AMBULATORY_CARE_PROVIDER_SITE_OTHER): Payer: BC Managed Care – PPO | Admitting: Family Medicine

## 2011-06-17 ENCOUNTER — Encounter: Payer: Self-pay | Admitting: Family Medicine

## 2011-06-17 VITALS — BP 130/82 | HR 94 | Temp 98.3°F | Ht 66.0 in | Wt 217.6 lb

## 2011-06-17 DIAGNOSIS — J302 Other seasonal allergic rhinitis: Secondary | ICD-10-CM | POA: Insufficient documentation

## 2011-06-17 DIAGNOSIS — J309 Allergic rhinitis, unspecified: Secondary | ICD-10-CM

## 2011-06-17 MED ORDER — FLUTICASONE PROPIONATE 50 MCG/ACT NA SUSP: 2.0000 | Freq: Every day | NASAL | Status: DC

## 2011-06-17 NOTE — Assessment & Plan Note (Signed)
New.  Pt's sxs severe.  Start nasal steroid.  Continue antihistamine.  Short term decongestant.  Reviewed supportive care and red flags that should prompt return.  Pt expressed understanding and is in agreement w/ plan.

## 2011-06-17 NOTE — Patient Instructions (Signed)
This all appears to be allergies Start the Flonase- 2 sprays in each nostril daily 3-5 days of Sudafed Continue the Zyrtec Call with any questions or concerns Happy Early [place] Ouch!!!

## 2011-06-17 NOTE — Progress Notes (Signed)
  Subjective:    Patient ID: Sherry Gallagher, female    DOB: 02-18-1955, 56 y.o.   MRN: [id number]  HPI Seasonal allergies- sxs started 3 weeks ago.  Using Zyrtec and afrin (intermittently- every 3 days).  + congestion, PND, itchy eyes.  No facial pain/pressure.  + cough.   Review of Systems For ROS see HPI     Objective:   Physical Exam  Vitals reviewed. Constitutional: She appears well-developed and well-nourished. No distress.  HENT:  Head: Normocephalic and atraumatic.  Right Ear: Tympanic membrane normal.  Left Ear: Tympanic membrane normal.  Nose: Mucosal edema and rhinorrhea present. Right sinus exhibits no maxillary sinus tenderness and no frontal sinus tenderness. Left sinus exhibits no maxillary sinus tenderness and no frontal sinus tenderness.  Mouth/Throat: Mucous membranes are normal. Posterior oropharyngeal erythema (w/ PND) present.  Eyes: Conjunctivae and EOM are normal. Pupils are equal, round, and reactive to light.  Neck: Normal range of motion. Neck supple.  Cardiovascular: Normal rate, regular rhythm and normal heart sounds.   Pulmonary/Chest: Effort normal and breath sounds normal. No respiratory distress. She has no wheezes. She has no rales.  Lymphadenopathy:    She has no cervical adenopathy.          Assessment & Plan:

## 2011-06-22 ENCOUNTER — Telehealth: Payer: Self-pay | Admitting: *Deleted

## 2011-06-22 NOTE — Telephone Encounter (Signed)
Please note following pt incoming call,   Caller: Sherry Gallagher/Patient; Phone Number: [phone]; Message from caller: Patient would like a callback from Dr Beverely Low regarding taking a hormone replacement therapy . Wants her opinion because OB/gyn wants her on it.

## 2011-06-22 NOTE — Telephone Encounter (Signed)
Will defer to GYN- if they want her on med that is their call.  No obvious reason not to.

## 2011-06-23 NOTE — Telephone Encounter (Signed)
.  left message to have patient return my call.  

## 2011-06-23 NOTE — Telephone Encounter (Signed)
Should not effect DM control.  If she doesn't feel comfortable taking med and has been doing fine off it, there is no reason to start unless GYN has very good reason for it.

## 2011-06-23 NOTE — Telephone Encounter (Signed)
Called pt to advise, pt states that she was given premarin after hysterectomy 12 years ago, was advised after 10years of taking to stop taking per no need from MD in [place] and pt notes that she feels fine,rencently started seeing prior GYN MD Jennette Kettle- MD Jennette Kettle advised that it is more beneficial to be on the patch then off the patch, was given a sample and pt read information online about the medication and notes for hot flashes and night sweats, pt concerned that she is trying to keep her DM under control and she fears that this medication may effect this, this nurse advised that taking any medication is Entirely up to her if she wants to take it or not, and that she will need to contact MD Neal/nurse to clarify more benefits to taking the medication per pt stated that MD Jennette Kettle advised this is a preventative medication and pt has read the pamphlet that was given at her OV with MD Jennette Kettle, pt also noted that MD Jennette Kettle did advise this medication WILL NOT effect her DM, and if something happens and it does then she can just stop taking it, pt understood and stated that she has confidence in both her PCP and GYN but mainly wants someone to tell her she does not really need to take this medication, this nurse again reiterated that the final decision is hers weather to take this medication or not , she has been informed via pamphlet and MD Jennette Kettle advice, and MD Beverely Low does not prescribe hormone therapy thus the need for the GYN, pt understood, please advise if any further information needs to be given to the pt per concern

## 2011-06-23 NOTE — Telephone Encounter (Signed)
Spoke to pt to advise results/instructions. Pt understood.  

## 2011-07-23 ENCOUNTER — Encounter: Payer: Self-pay | Admitting: Family Medicine

## 2011-07-23 ENCOUNTER — Ambulatory Visit (INDEPENDENT_AMBULATORY_CARE_PROVIDER_SITE_OTHER): Payer: BC Managed Care – PPO | Admitting: Family Medicine

## 2011-07-23 VITALS — BP 120/84 | HR 91 | Temp 98.2°F | Resp 16 | Ht 66.0 in | Wt 216.4 lb

## 2011-07-23 DIAGNOSIS — N390 Urinary tract infection, site not specified: Secondary | ICD-10-CM

## 2011-07-23 DIAGNOSIS — R300 Dysuria: Secondary | ICD-10-CM

## 2011-07-23 DIAGNOSIS — R109 Unspecified abdominal pain: Secondary | ICD-10-CM

## 2011-07-23 LAB — POCT URINALYSIS DIPSTICK
Glucose, UA: NEGATIVE
Nitrite, UA: NEGATIVE
Urobilinogen, UA: 0.2

## 2011-07-23 MED ORDER — NITROFURANTOIN MONOHYD MACRO 100 MG PO CAPS: 100.0000 mg | ORAL_CAPSULE | Freq: Two times a day (BID) | ORAL | Status: DC

## 2011-07-23 NOTE — Patient Instructions (Addendum)
This is a UTI Start the Macrobid twice daily Continue to drink plenty of water Call with any questions or concerns I'll miss you!!!

## 2011-07-23 NOTE — Progress Notes (Signed)
  Subjective:    Patient ID: Sherry Gallagher, female    DOB: 01-28-1956, 56 y.o.   MRN: [id number]  HPI ? UTI- sxs started Sunday w/ dysuria.  Monday afternoon had low back pain.  No fevers.  + frequency.  No CVA tenderness.   Review of Systems For ROS see HPI     Objective:   Physical Exam  Vitals reviewed. Constitutional: She appears well-developed and well-nourished. No distress.  Abdominal: Soft. She exhibits no distension. There is no tenderness (no suprapubic or CVA tenderness).          Assessment & Plan:

## 2011-07-23 NOTE — Assessment & Plan Note (Signed)
Pt's sxs and PE consistent w/ infxn.  Start abx.  Reviewed supportive care and red flags that should prompt return.  Pt expressed understanding and is in agreement w/ plan.  

## 2011-07-26 ENCOUNTER — Telehealth: Payer: Self-pay | Admitting: Family Medicine

## 2011-07-26 LAB — URINE CULTURE: Colony Count: 75000

## 2011-07-26 MED ORDER — FLUTICASONE-SALMETEROL 250-50 MCG/DOSE IN AEPB: 1.0000 | INHALATION_SPRAY | Freq: Two times a day (BID) | RESPIRATORY_TRACT | Status: DC

## 2011-07-26 MED ORDER — ALBUTEROL SULFATE (2.5 MG/3ML) 0.083% IN NEBU: 2.5000 mg | INHALATION_SOLUTION | Freq: Every day | RESPIRATORY_TRACT | Status: DC | PRN

## 2011-07-26 NOTE — Telephone Encounter (Signed)
Refill: Albuterol 0.083% (2.5mg /51ml) 60X34ml. Inhale the contents of 1 vial via nebulizer every 4 hours as needed. Qty 180. Last fill 06-16-10 Advair diskus 250/55mcg (yellow). Use 1 inhalation twice daily. Qty 60. Last fill 05-21-11

## 2011-07-26 NOTE — Telephone Encounter (Signed)
rx sent to pharmacy by e-script  

## 2011-08-05 ENCOUNTER — Other Ambulatory Visit: Payer: Self-pay | Admitting: Family Medicine

## 2011-08-05 MED ORDER — BENAZEPRIL-HYDROCHLOROTHIAZIDE 20-25 MG PO TABS: ORAL_TABLET | ORAL | Status: DC

## 2011-08-05 NOTE — Telephone Encounter (Signed)
Refill Benazepril/hctz 20/25mg  tablets #30, take 1/2 tablet by mouth daily, last fill 5.5.13, last ov 6.14.13

## 2011-08-05 NOTE — Telephone Encounter (Signed)
OK with me.

## 2011-08-05 NOTE — Telephone Encounter (Signed)
Please change provider to Dr Caryl Never and thank you.  See phone note please.

## 2011-08-05 NOTE — Telephone Encounter (Signed)
Pt is requesting to transfer from Dr Beverely Low to Dr Caryl Never due to work/living [place] this side of town

## 2011-08-05 NOTE — Telephone Encounter (Signed)
Ok to refill meds x6 months Ok to switch, this was discussed w/ pt at last OV

## 2011-08-06 MED ORDER — BENAZEPRIL-HYDROCHLOROTHIAZIDE 20-25 MG PO TABS: ORAL_TABLET | ORAL | Status: DC

## 2011-08-06 NOTE — Telephone Encounter (Signed)
Yes, need to refill Benazepril/HCTZ 20/25, #30, x6

## 2011-08-06 NOTE — Telephone Encounter (Signed)
rx sent to pharmacy by e-script for refills, PCP changed in chart

## 2011-08-06 NOTE — Telephone Encounter (Signed)
Pt is sch for 09-01-2011 415pm

## 2011-08-06 NOTE — Telephone Encounter (Signed)
Please advise if Benazepril/hctz 20/25mg  tablets is the medication pt needs to be switched to?  If MD Beverely Low is to still fill and note PCP change has been made in chart

## 2011-08-06 NOTE — Telephone Encounter (Signed)
Please note change in PCP

## 2011-08-06 NOTE — Addendum Note (Signed)
Addended by: Derry Lory A on: 08/06/2011 01:53 PM   Modules accepted: Orders

## 2011-08-20 ENCOUNTER — Encounter: Payer: Self-pay | Admitting: Family Medicine

## 2011-08-20 ENCOUNTER — Telehealth: Payer: Self-pay | Admitting: Family Medicine

## 2011-08-20 ENCOUNTER — Ambulatory Visit (INDEPENDENT_AMBULATORY_CARE_PROVIDER_SITE_OTHER): Payer: BC Managed Care – PPO | Admitting: Family Medicine

## 2011-08-20 VITALS — BP 128/84 | HR 98 | Temp 98.2°F | Ht 66.75 in | Wt 215.4 lb

## 2011-08-20 DIAGNOSIS — R309 Painful micturition, unspecified: Secondary | ICD-10-CM

## 2011-08-20 DIAGNOSIS — R300 Dysuria: Secondary | ICD-10-CM

## 2011-08-20 DIAGNOSIS — N23 Unspecified renal colic: Secondary | ICD-10-CM

## 2011-08-20 LAB — POCT URINALYSIS DIPSTICK
Blood, UA: NEGATIVE
Glucose, UA: NEGATIVE
Spec Grav, UA: 1.01
Urobilinogen, UA: 0.2

## 2011-08-20 MED ORDER — NITROFURANTOIN MONOHYD MACRO 100 MG PO CAPS: 100.0000 mg | ORAL_CAPSULE | Freq: Two times a day (BID) | ORAL | Status: AC

## 2011-08-20 NOTE — Progress Notes (Signed)
  Subjective:    Patient ID: Sherry Gallagher, female    DOB: 21-Nov-1955, 56 y.o.   MRN: [id number]  HPI ? UTI- hx of similar (last seen 6/14).  sxs started Wednesday w/ frequency, urgency.  No back pain.  No dysuria.  Feels similar to previous infxn.  No fevers.   Review of Systems For ROS see HPI     Objective:   Physical Exam  Vitals reviewed. Constitutional: She appears well-developed and well-nourished. No distress.  Abdominal: Soft. She exhibits no distension. There is no tenderness (no suprapubic or CVA tenderness).          Assessment & Plan:

## 2011-08-20 NOTE — Telephone Encounter (Signed)
Caller: Sherry Gallagher/Patient; PCP: Sheliah Hatch.; CB#: [phone]; ; ; Call regarding Urinary Pain/Bleeding;   Patient states she was treated for a UTI approx. 1 month ago by Dr Beverely Low. States she developed urinary frequency, urgency, burning with urination. Onset 08/18/11. Denies hematuria. Afebrile. States intermittent right flank discomfort. Patient taking fluids well. Triage per [facility] Protocol. No emergent sx identified. Care advice given per guidelines. Patient advised increased fluids, Ibuprofen 600mg . q 6 hours prn for pain, take with food. Patient states she is trasitioning to Dr. Caryl Never at [facility] office due to changing employment location. States has initial appt. scheduled with Dr.Burchette on 09/01/11. RN spoke with [place], in [place] office. States patient would not be able to be seen at [employer] office due to not having initial office with Dr. Docia Furl. States patient would need to be seen at [facility] office location. Patient advised of above and agreeable. Appt. scheduled for 08/20/11 1345 with Dr. Beverely Low at [place] office.

## 2011-08-20 NOTE — Patient Instructions (Addendum)
This again appears to be an infection Start the [employer] again Drink plenty of fluids Hang in there!!!

## 2011-08-20 NOTE — Assessment & Plan Note (Signed)
Pt's sxs and UA again consistent w/ infxn.  Start Macrobid.  Reviewed supportive care and red flags that should prompt return.  Pt expressed understanding and is in agreement w/ plan.

## 2011-08-23 LAB — URINE CULTURE: Colony Count: 30000

## 2011-09-01 ENCOUNTER — Encounter: Payer: Self-pay | Admitting: Family Medicine

## 2011-09-01 ENCOUNTER — Ambulatory Visit (INDEPENDENT_AMBULATORY_CARE_PROVIDER_SITE_OTHER): Payer: BC Managed Care – PPO | Admitting: Family Medicine

## 2011-09-01 VITALS — BP 130/80 | HR 88 | Temp 98.3°F | Resp 12 | Ht 66.0 in | Wt 216.0 lb

## 2011-09-01 DIAGNOSIS — E785 Hyperlipidemia, unspecified: Secondary | ICD-10-CM

## 2011-09-01 DIAGNOSIS — I10 Essential (primary) hypertension: Secondary | ICD-10-CM

## 2011-09-01 DIAGNOSIS — E119 Type 2 diabetes mellitus without complications: Secondary | ICD-10-CM

## 2011-09-01 LAB — HM DIABETES FOOT EXAM: HM Diabetic Foot Exam: NORMAL

## 2011-09-01 NOTE — Progress Notes (Signed)
  Subjective:    Patient ID: Sherry Gallagher, female    DOB: July 26, 1955, 56 y.o.   MRN: [id number]  HPI  Patient new to this clinic to establish care. Has previously seen Dr. Beverely Low.  She has history of type 2 diabetes diagnosed about 3 or 4 years ago, hypertension, hyperlipidemia, asthma, and seasonal allergic rhinitis. She had episode of pneumonia last year. Asthma has been generally well-controlled. Diabetes has been well controlled last A1c 6.3%. Januvia was discontinued at that time. She exercises regularly with swimming. Getting regular eye checkups. No history of complications such as retinopathy or neuropathy. She takes metformin for diabetes. No symptoms of hyperglycemia.  Blood pressures been well controlled. No orthostasis. Lipids are treated with pravastatin. Most recent LDL slightly over 100. She has continued to lose some weight due to her efforts. Nonsmoker.  Patient works in [employer]. Nonsmoker. No regular alcohol use.  Past Medical History  Diagnosis Date  . Diabetes mellitus   . Asthma   . GERD (gastroesophageal reflux disease)   . Allergy   . Pneumonia    Past Surgical History  Procedure Date  . Abdominal hysterectomy     reports that she has never smoked. She does not have any smokeless tobacco history on file. She reports that she does not drink alcohol or use illicit drugs. family history includes Alcohol abuse in her father and Cancer in her maternal grandmother and paternal grandmother. Allergies  Allergen Reactions  . Penicillins       Review of Systems  Constitutional: Negative for fatigue and unexpected weight change.  Eyes: Negative for visual disturbance.  Respiratory: Negative for cough, chest tightness, shortness of breath and wheezing.   Cardiovascular: Negative for chest pain, palpitations and leg swelling.  Genitourinary: Negative for dysuria.  Neurological: Negative for dizziness, seizures, syncope, weakness, light-headedness  and headaches.       Objective:   Physical Exam  Constitutional: She appears well-developed and well-nourished.  HENT:  Mouth/Throat: Oropharynx is clear and moist.  Neck: Neck supple. No thyromegaly present.  Cardiovascular: Normal rate and regular rhythm.   Pulmonary/Chest: Effort normal and breath sounds normal. No respiratory distress. She has no wheezes. She has no rales.  Musculoskeletal: She exhibits no edema.       Feet reveal no skin lesions. Good distal foot pulses. Good capillary refill. No calluses. Normal sensation with monofilament testing           Assessment & Plan:  #1 type 2 diabetes. History of good control. Repeat A1c. Continue regular eye exams. Continue regular exercise habits.  #2 hypertension well controlled continue benazepril HCTZ  #3 hyperlipidemia. Repeat lipids within 6 months. If LDL not below 100 that point titrate pravastatin  #4 health maintenance. Patient needs tetanus booster and Pneumovax. She is not interested at this time but would like to consider at followup

## 2011-09-01 NOTE — Patient Instructions (Signed)
Consider Pneumovax (pneumonia vaccine) and Tdap at some point this year.

## 2011-09-02 LAB — HEMOGLOBIN A1C: Hgb A1c MFr Bld: 6.5 % (ref 4.6–6.5)

## 2011-09-02 NOTE — Progress Notes (Signed)
Quick Note:  Pt informed ______ 

## 2011-09-04 LAB — HM DIABETES EYE EXAM: HM Diabetic Eye Exam: NORMAL

## 2011-09-17 ENCOUNTER — Telehealth: Payer: Self-pay | Admitting: Family Medicine

## 2011-09-17 MED ORDER — PRAVASTATIN SODIUM 40 MG PO TABS: 40.0000 mg | ORAL_TABLET | Freq: Every day | ORAL | Status: DC

## 2011-09-17 NOTE — Telephone Encounter (Signed)
rx sent to pharmacy by e-script Letter has been mailed to pt address noted in the chart to advise they are overdue for cpe/ov/labs and the pt needs to contact office to set up appt  Needs CPE with fasting labs

## 2011-09-17 NOTE — Telephone Encounter (Signed)
Refill: pravastatin 40mg  tablets. Take 1 tablet by mouth daily. Qty 30. Last fill 6.26.13  *Patient recently switched to [place] location*

## 2011-11-11 ENCOUNTER — Other Ambulatory Visit: Payer: Self-pay | Admitting: Family Medicine

## 2011-12-30 ENCOUNTER — Other Ambulatory Visit (INDEPENDENT_AMBULATORY_CARE_PROVIDER_SITE_OTHER): Payer: BC Managed Care – PPO

## 2011-12-30 DIAGNOSIS — Z0000 Encounter for general adult medical examination without abnormal findings: Secondary | ICD-10-CM

## 2011-12-30 LAB — HEPATIC FUNCTION PANEL
ALT: 18 U/L (ref 0–35)
AST: 21 U/L (ref 0–37)
Albumin: 4.1 g/dL (ref 3.5–5.2)
Alkaline Phosphatase: 76 U/L (ref 39–117)
Bilirubin, Direct: 0 mg/dL (ref 0.0–0.3)
Total Protein: 7.7 g/dL (ref 6.0–8.3)

## 2011-12-30 LAB — POCT URINALYSIS DIPSTICK
Blood, UA: NEGATIVE
Nitrite, UA: NEGATIVE
Protein, UA: NEGATIVE
Spec Grav, UA: 1.015
Urobilinogen, UA: 0.2
pH, UA: 6

## 2011-12-30 LAB — CBC WITH DIFFERENTIAL/PLATELET
Basophils Relative: 1 % (ref 0.0–3.0)
Eosinophils Relative: 5 % (ref 0.0–5.0)
Hemoglobin: 14.1 g/dL (ref 12.0–15.0)
Lymphocytes Relative: 33.4 % (ref 12.0–46.0)
MCHC: 33 g/dL (ref 30.0–36.0)
Monocytes Relative: 7.2 % (ref 3.0–12.0)
Neutro Abs: 4.1 10*3/uL (ref 1.4–7.7)
RBC: 4.55 Mil/uL (ref 3.87–5.11)
WBC: 7.7 10*3/uL (ref 4.5–10.5)

## 2011-12-30 LAB — BASIC METABOLIC PANEL
CO2: 29 mEq/L (ref 19–32)
Calcium: 9.6 mg/dL (ref 8.4–10.5)
GFR: 82.3 mL/min (ref >60.00)
Sodium: 140 mEq/L (ref 135–145)

## 2011-12-30 LAB — LIPID PANEL
HDL: 52 mg/dL (ref >39.00)
Total CHOL/HDL Ratio: 3
Triglycerides: 161 mg/dL — ABNORMAL HIGH (ref 0.0–149.0)
VLDL: 32.2 mg/dL (ref 0.0–40.0)

## 2012-01-05 ENCOUNTER — Encounter: Payer: Self-pay | Admitting: Family Medicine

## 2012-01-05 ENCOUNTER — Ambulatory Visit (INDEPENDENT_AMBULATORY_CARE_PROVIDER_SITE_OTHER): Payer: BC Managed Care – PPO | Admitting: Family Medicine

## 2012-01-05 VITALS — BP 134/68 | HR 72 | Temp 98.2°F | Resp 12 | Ht 65.5 in | Wt 226.0 lb

## 2012-01-05 DIAGNOSIS — R300 Dysuria: Secondary | ICD-10-CM

## 2012-01-05 DIAGNOSIS — Z0000 Encounter for general adult medical examination without abnormal findings: Secondary | ICD-10-CM

## 2012-01-05 LAB — POCT URINALYSIS DIPSTICK
Ketones, UA: NEGATIVE
Spec Grav, UA: 1.015
Urobilinogen, UA: 0.2

## 2012-01-05 MED ORDER — NITROFURANTOIN MONOHYD MACRO 100 MG PO CAPS: 100.0000 mg | ORAL_CAPSULE | Freq: Two times a day (BID) | ORAL | Status: DC

## 2012-01-05 NOTE — Patient Instructions (Signed)
Try to establish regular aerobic exercise Work on weight control Continue yearly eye exam

## 2012-01-05 NOTE — Progress Notes (Signed)
  Subjective:    Patient ID: Sherry Gallagher, female    DOB: [birth year], 56 y.o.   MRN: [id number]  HPI  Complete physical. Patient sees gynecologist regularly. Recent mammogram and Pap smear normal. Declines flu vaccine. No history of Pneumovax. She does have asthma which has been well controlled. She agrees to Pneumovax today. She's had some weight gain recently. No consistent exercise.  She's had a couple days of mild burning with urination. No fever. No back pain. No chills. No nausea or vomiting.  Colonoscopy estimated 3 years ago. Questionable history of colon polyps in the past.  Past Medical History  Diagnosis Date  . Diabetes mellitus   . Asthma   . GERD (gastroesophageal reflux disease)   . Allergy   . Pneumonia    Past Surgical History  Procedure Date  . Abdominal hysterectomy     reports that she has never smoked. She does not have any smokeless tobacco history on file. She reports that she does not drink alcohol or use illicit drugs. family history includes Alcohol abuse in her father and Cancer in her maternal grandmother and paternal grandmother. Allergies  Allergen Reactions  . Penicillins      Review of Systems  Constitutional: Negative for fever, activity change, appetite change, fatigue and unexpected weight change.  HENT: Negative for hearing loss, ear pain, sore throat and trouble swallowing.   Eyes: Negative for visual disturbance.  Respiratory: Negative for cough and shortness of breath.   Cardiovascular: Negative for chest pain and palpitations.  Gastrointestinal: Negative for abdominal pain, diarrhea, constipation and blood in stool.  Genitourinary: Negative for dysuria and hematuria.  Musculoskeletal: Negative for myalgias, back pain and arthralgias.  Skin: Negative for rash.  Neurological: Negative for dizziness, syncope and headaches.  Hematological: Negative for adenopathy.  Psychiatric/Behavioral: Negative for confusion and dysphoric mood.         Objective:   Physical Exam  Constitutional: She is oriented to person, place, and time. She appears well-developed and well-nourished.  HENT:  Head: Normocephalic and atraumatic.  Eyes: EOM are normal. Pupils are equal, round, and reactive to light.  Neck: Normal range of motion. Neck supple. No thyromegaly present.  Cardiovascular: Normal rate, regular rhythm and normal heart sounds.   No murmur heard. Pulmonary/Chest: Breath sounds normal. No respiratory distress. She has no wheezes. She has no rales.  Abdominal: Soft. Bowel sounds are normal. She exhibits no distension and no mass. There is no tenderness. There is no rebound and no guarding.  Genitourinary:       Per gyn  Musculoskeletal: Normal range of motion. She exhibits no edema.  Lymphadenopathy:    She has no cervical adenopathy.  Neurological: She is alert and oriented to person, place, and time. She displays normal reflexes. No cranial nerve deficit.  Skin: No rash noted.  Psychiatric: She has a normal mood and affect. Her behavior is normal. Judgment and thought content normal.          Assessment & Plan:  Complete physical. Patient will continue to see gynecologist. Labs reviewed with patient. A1c remains well controlled. We've recommended trying to establish more consistent exercise. Pneumovax given. Patient declines flu vaccine though this was recommended.  Dysuria. Urine dipstick relatively unremarkable. Only trace leukocytes otherwise normal. Prescription for Macrobid 1 twice a day if she has any progressive urinary symptoms otherwise observe.

## 2012-01-25 ENCOUNTER — Other Ambulatory Visit: Payer: Self-pay | Admitting: Family Medicine

## 2012-01-26 ENCOUNTER — Other Ambulatory Visit: Payer: Self-pay | Admitting: *Deleted

## 2012-01-26 MED ORDER — METFORMIN HCL ER (OSM) 1000 MG PO TB24: 1000.0000 mg | ORAL_TABLET | Freq: Every day | ORAL | Status: DC

## 2012-02-09 HISTORY — PX: COLONOSCOPY: SHX174

## 2012-03-08 ENCOUNTER — Other Ambulatory Visit: Payer: Self-pay | Admitting: Family Medicine

## 2012-03-15 LAB — HM DIABETES EYE EXAM: HM Diabetic Eye Exam: NORMAL

## 2012-03-21 ENCOUNTER — Other Ambulatory Visit: Payer: Self-pay | Admitting: Family Medicine

## 2012-03-21 ENCOUNTER — Telehealth: Payer: Self-pay | Admitting: Family Medicine

## 2012-03-21 DIAGNOSIS — K219 Gastro-esophageal reflux disease without esophagitis: Secondary | ICD-10-CM

## 2012-03-21 NOTE — Telephone Encounter (Deleted)
FYI: Pt needs refill on nexium, looks like PCP is Burchette. Unsure why Dr. Beverely Low received request.

## 2012-03-21 NOTE — Telephone Encounter (Signed)
Caller: [place]/Patient; Phone: [phone]; Reason for Call: Patient calling about nexium Rx.  States has been taking it as ordered by Dr.  Beverely Low for a long time, but states her insurance is now requiring prior authorization "by the doctor who prescribed it, " and will not fill refill until PA is done.  [facility]ses [facility].  Insurance is [employer], [place] [id number], group [id number]; [phone].  Info to office for staff/provider review/PA/callback.  May reach patient at [phone].  Krs/can

## 2012-03-22 NOTE — Telephone Encounter (Signed)
Prior auth initiated. Encounter closed.

## 2012-03-30 ENCOUNTER — Telehealth: Payer: Self-pay | Admitting: Family Medicine

## 2012-03-30 NOTE — Telephone Encounter (Signed)
Let's try Aciphex 20 mg daily.

## 2012-03-30 NOTE — Telephone Encounter (Signed)
Patient's prior auth for Nexium was denied by [employer]. Must try one of the following: Omeprazole, pantoprazole, Aciphex, or Dexilant. [facility] on [address]. Thank you.

## 2012-03-31 MED ORDER — RABEPRAZOLE SODIUM 20 MG PO TBEC: 20.0000 mg | DELAYED_RELEASE_TABLET | Freq: Every day | ORAL | Status: DC

## 2012-03-31 NOTE — Telephone Encounter (Signed)
Pt informed

## 2012-04-28 ENCOUNTER — Telehealth: Payer: Self-pay | Admitting: Family Medicine

## 2012-04-28 ENCOUNTER — Emergency Department (HOSPITAL_BASED_OUTPATIENT_CLINIC_OR_DEPARTMENT_OTHER)
Admission: EM | Admit: 2012-04-28 | Discharge: 2012-04-28 | Disposition: A | Discharge status: Home / Self Care | Payer: 59 | Attending: Emergency Medicine | Admitting: Emergency Medicine

## 2012-04-28 ENCOUNTER — Encounter (HOSPITAL_BASED_OUTPATIENT_CLINIC_OR_DEPARTMENT_OTHER): Payer: Self-pay | Admitting: Emergency Medicine

## 2012-04-28 DIAGNOSIS — Z8701 Personal history of pneumonia (recurrent): Secondary | ICD-10-CM | POA: Insufficient documentation

## 2012-04-28 DIAGNOSIS — K219 Gastro-esophageal reflux disease without esophagitis: Secondary | ICD-10-CM | POA: Insufficient documentation

## 2012-04-28 DIAGNOSIS — IMO0002 Reserved for concepts with insufficient information to code with codable children: Secondary | ICD-10-CM | POA: Insufficient documentation

## 2012-04-28 DIAGNOSIS — J45909 Unspecified asthma, uncomplicated: Secondary | ICD-10-CM | POA: Insufficient documentation

## 2012-04-28 DIAGNOSIS — R197 Diarrhea, unspecified: Secondary | ICD-10-CM | POA: Insufficient documentation

## 2012-04-28 DIAGNOSIS — R109 Unspecified abdominal pain: Secondary | ICD-10-CM | POA: Insufficient documentation

## 2012-04-28 DIAGNOSIS — Z79899 Other long term (current) drug therapy: Secondary | ICD-10-CM | POA: Insufficient documentation

## 2012-04-28 DIAGNOSIS — K5289 Other specified noninfective gastroenteritis and colitis: Secondary | ICD-10-CM | POA: Insufficient documentation

## 2012-04-28 DIAGNOSIS — K529 Noninfective gastroenteritis and colitis, unspecified: Secondary | ICD-10-CM

## 2012-04-28 DIAGNOSIS — E119 Type 2 diabetes mellitus without complications: Secondary | ICD-10-CM | POA: Insufficient documentation

## 2012-04-28 LAB — URINALYSIS, ROUTINE W REFLEX MICROSCOPIC
Glucose, UA: NEGATIVE mg/dL
Hgb urine dipstick: NEGATIVE
Ketones, ur: 15 mg/dL — AB
Leukocytes, UA: NEGATIVE
pH: 5 (ref 5.0–8.0)

## 2012-04-28 LAB — CBC WITH DIFFERENTIAL/PLATELET
HCT: 45.5 % (ref 36.0–46.0)
Hemoglobin: 15.4 g/dL — ABNORMAL HIGH (ref 12.0–15.0)
Lymphocytes Relative: 3 % — ABNORMAL LOW (ref 12–46)
MCHC: 33.8 g/dL (ref 30.0–36.0)
MCV: 91.4 fL (ref 78.0–100.0)
Monocytes Absolute: 0.5 10*3/uL (ref 0.1–1.0)
Monocytes Relative: 3 % (ref 3–12)
Neutro Abs: 13.9 10*3/uL — ABNORMAL HIGH (ref 1.7–7.7)
WBC: 14.8 10*3/uL — ABNORMAL HIGH (ref 4.0–10.5)

## 2012-04-28 LAB — COMPREHENSIVE METABOLIC PANEL
ALT: 16 U/L (ref 0–35)
AST: 17 U/L (ref 0–37)
Albumin: 4 g/dL (ref 3.5–5.2)
CO2: 24 mEq/L (ref 19–32)
Calcium: 9.6 mg/dL (ref 8.4–10.5)
Sodium: 138 mEq/L (ref 135–145)
Total Protein: 8 g/dL (ref 6.0–8.3)

## 2012-04-28 MED ORDER — KETOROLAC TROMETHAMINE 30 MG/ML IJ SOLN
30.0000 mg | Freq: Once | INTRAMUSCULAR | Status: AC
  Administered 2012-04-28: 30 mg via INTRAVENOUS
  Filled 2012-04-28: qty 1

## 2012-04-28 MED ORDER — SODIUM CHLORIDE 0.9 % IV SOLN
Freq: Once | INTRAVENOUS | Status: AC
  Administered 2012-04-28: 19:00:00 via INTRAVENOUS

## 2012-04-28 MED ORDER — ONDANSETRON HCL 8 MG PO TABS: 8.0000 mg | ORAL_TABLET | Freq: Three times a day (TID) | ORAL | Status: DC | PRN

## 2012-04-28 MED ORDER — ONDANSETRON HCL 4 MG/2ML IJ SOLN
4.0000 mg | Freq: Once | INTRAMUSCULAR | Status: AC
  Administered 2012-04-28: 4 mg via INTRAVENOUS
  Filled 2012-04-28: qty 2

## 2012-04-28 NOTE — ED Provider Notes (Signed)
History     CSN: [id number]  Arrival date & time 04/28/12  1757   First MD Initiated Contact with Patient 04/28/12 1821      Chief Complaint  Patient presents with  . Emesis    (Consider location/radiation/quality/duration/timing/severity/associated sxs/prior treatment) HPI Comments: The patient presents with severe n/v/d since 11:30 AM.  She has been unable to keep anything down.  There is no blood.  No fevers.  Son with similar illness last week.    Patient is a 57 y.o. female presenting with vomiting. The history is provided by the patient.  Emesis Severity:  Moderate Duration:  12 hours Timing:  Constant Quality:  Stomach contents Able to tolerate:  Liquids and solids Progression:  Worsening Chronicity:  New Recent urination:  Decreased Relieved by:  Nothing Worsened by:  Nothing tried Associated symptoms: abdominal pain and diarrhea   Associated symptoms: no fever     Past Medical History  Diagnosis Date  . Diabetes mellitus   . Asthma   . GERD (gastroesophageal reflux disease)   . Allergy   . Pneumonia     Past Surgical History  Procedure Laterality Date  . Abdominal hysterectomy    . Nasal sinus surgery      Family History  Problem Relation Age of Onset  . Alcohol abuse Father   . Cancer Maternal Grandmother     breast  . Cancer Paternal Grandmother     breast    History  Substance Use Topics  . Smoking status: Never Smoker   . Smokeless tobacco: Not on file  . Alcohol Use: No    OB History   Grav Para Term Preterm Abortions TAB SAB Ect Mult Living                  Review of Systems  Gastrointestinal: Positive for vomiting, abdominal pain and diarrhea.  All other systems reviewed and are negative.    Allergies  Penicillins  Home Medications   Current Outpatient Rx  Name  Route  Sig  Dispense  Refill  . albuterol (PROVENTIL) (2.5 MG/3ML) 0.083% nebulizer solution   Nebulization   Take 3 mLs (2.5 mg total) by nebulization daily  as needed.   75 mL   3   . benazepril-hydrochlorthiazide (LOTENSIN HCT) 20-25 MG per tablet      Take 1/2 tab by mouth daily   30 tablet   6   . cetirizine (ZYRTEC) 10 MG tablet   Oral   Take 10 mg by mouth daily. Every other day          . fluticasone (FLONASE) 50 MCG/ACT nasal spray   Nasal   Place 2 sprays into the nose daily.   16 g   6   . Fluticasone-Salmeterol (ADVAIR) 250-50 MCG/DOSE AEPB   Inhalation   Inhale 1 puff into the lungs every 12 (twelve) hours.   60 each   3   . LIFESCAN FINEPOINT LANCETS MISC      USE AS DIRECTED   100 each   3   . metformin (FORTAMET) 1000 MG (OSM) 24 hr tablet   Oral   Take 1 tablet (1,000 mg total) by mouth daily with breakfast.   90 tablet   3   . NEXIUM 40 MG capsule      TAKE 1 CAPSULE BY MOUTH DAILY   30 capsule   0   . nitrofurantoin, macrocrystal-monohydrate, (MACROBID) 100 MG capsule   Oral   Take 1 capsule (100  mg total) by mouth 2 (two) times daily.   10 capsule   0   . ONE TOUCH ULTRA TEST test strip      USE AS DIRECTED   100 each   3   . pravastatin (PRAVACHOL) 40 MG tablet      TAKE 1 TABLET BY MOUTH DAILY   90 tablet   3   . RABEprazole (ACIPHEX) 20 MG tablet   Oral   Take 1 tablet (20 mg total) by mouth daily.   30 tablet   11     BP 134/81  Pulse 123  Temp(Src) 98.3 F (36.8 C) (Oral)  Resp 20  Ht 5\' 6"  (1.676 m)  Wt 200 lb (90.719 kg)  BMI 32.3 kg/m2  SpO2 96%  Physical Exam  Nursing note and vitals reviewed. Constitutional: She is oriented to person, place, and time. She appears well-developed and well-nourished. No distress.  HENT:  Head: Normocephalic and atraumatic.  Neck: Normal range of motion. Neck supple.  Cardiovascular: Normal rate and regular rhythm.  Exam reveals no gallop and no friction rub.   No murmur heard. Pulmonary/Chest: Effort normal and breath sounds normal. No respiratory distress. She has no wheezes.  Abdominal: Soft. Bowel sounds are normal. She  exhibits no distension. There is no tenderness.  Musculoskeletal: Normal range of motion.  Neurological: She is alert and oriented to person, place, and time.  Skin: Skin is warm and dry. She is not diaphoretic.    ED Course  Procedures (including critical care time)  Labs Reviewed  URINALYSIS, ROUTINE W REFLEX MICROSCOPIC  CBC WITH DIFFERENTIAL  COMPREHENSIVE METABOLIC PANEL  LIPASE, BLOOD   No results found.   No diagnosis found.   Date: 04/28/2012  Rate: 117  Rhythm: sinus tachycardia  QRS Axis: normal  Intervals: normal  ST/T Wave abnormalities: normal  Conduction Disutrbances:none  Narrative Interpretation:   Old EKG Reviewed: none available    MDM  The patient presents with n/v/d since earlier today.  The wbc is elevated at 14.8, but lfts, urine, and lipase are unremarkable.  I suspect a viral etiology.  There is no RLQ ttp or RUQ ttp to cause concern for cholecystitis or appendicitis.  She is feeling better with fluids and meds in the ED.  Will discharge with zofran, fluids as tolerated, return prn.        Geoffery Lyons, MD 04/28/12 2011

## 2012-04-28 NOTE — Telephone Encounter (Signed)
Patient Information:  Caller Name: [place]  Phone: [phone]  Patient: Sherry Gallagher, Sherry Gallagher  Gender: Female  DOB: 07/12/1955  Age: 57 Years  PCP: Evelena Peat [facility])  Office Follow Up:  Does the office need to follow up with this patient?: No  Instructions For The Office: N/A  RN Note:  sudden onset of vomiting and a couple loose stools.  she has intermittent cramping.  She said she doesn't feel herself when she gets up but not truly dizzy.  she is diabetic and blood sugar at 1330 was 120.  She urinated about 1645 but says mouth is dry  She has vomited at least 6 times since 1130  Symptoms  Reason For Call & Symptoms: vomiting onset 1130  Reviewed Health History In EMR: Yes  Reviewed Medications In EMR: Yes  Reviewed Allergies In EMR: Yes  Reviewed Surgeries / Procedures: Yes  Date of Onset of Symptoms: 04/28/2012  Guideline(s) Used:  Vomiting  Disposition Per Guideline:   Go to ED Now  Reason For Disposition Reached:   Severe vomiting (e.g., 6 or more times/day)  Advice Given:  N/A  RN Overrode Recommendation:  Document Patient  she is not sure she is going to go to the ED

## 2012-04-28 NOTE — ED Notes (Signed)
MD at bedside. 

## 2012-04-28 NOTE — ED Notes (Signed)
Nausea and vomiting since 11:30 am.

## 2012-05-12 ENCOUNTER — Telehealth: Payer: Self-pay | Admitting: Family Medicine

## 2012-05-12 NOTE — Telephone Encounter (Signed)
Patient Information:  Caller Name: [place]  Phone: [phone]  Patient: Sherry Gallagher, Sherry Gallagher  Gender: Female  DOB: 05/04/1955  Age: 57 Years  PCP: Evelena Peat (Family Practice)  Office Follow Up:  Does the office need to follow up with this patient?: Yes  Instructions For The Office: Please see if Metformin medication can be changed from 24 hour tablet to regular tablet for insurance purposes.  [facility] [address]  ALSO, pt wanted to set up an appt to have her A1C checked.  (Dr Caryl Never has said to have it checked every 4 months) Pt is due but cannot schedule appt without an order.  OFFICE PLEASE FOLLOW UP ON METFORMIN RX CHANGES AND ORDER FOR AIC TO BE CHECKED  RN Note:  Pt is calling to see if medication can be changed to the regular metformin.  Pt would like a 3 month supply.  Symptoms  Reason For Call & Symptoms: pt now has [facility].  Pt went to get refill of Metformin.  60$ for 30 day supply versus 30$ for 90 day supply (different copays for different tiers) Metfromin 24 hour tablet is teir 3 and has a 60$ copay vs regular metformin is teir 1 and has a 10$ copay.  Reviewed Health History In EMR: No  Reviewed Medications In EMR: No  Reviewed Allergies In EMR: No  Reviewed Surgeries / Procedures: No  Date of Onset of Symptoms: Unknown  Guideline(s) Used:  No Protocol Available - Information Only  Disposition Per Guideline:   Discuss with PCP and Callback by Nurse Today  Reason For Disposition Reached:   Nursing judgment  Advice Given:  N/A  Patient Will Follow Care Advice:  YES

## 2012-05-12 NOTE — Telephone Encounter (Signed)
OK to change to regular metformin and I would recommend 500 mg bid.

## 2012-05-15 ENCOUNTER — Telehealth: Payer: Self-pay | Admitting: Family Medicine

## 2012-05-15 MED ORDER — METFORMIN HCL 500 MG PO TABS: 500.0000 mg | ORAL_TABLET | Freq: Two times a day (BID) | ORAL | Status: DC

## 2012-05-15 NOTE — Telephone Encounter (Signed)
Rx sent as requested, pt informed

## 2012-05-15 NOTE — Telephone Encounter (Signed)
Patient is calling in states that she spoke with office last week to request that Metformin be changed to the regular metformin due to insurance reasons.  Patient states that Pharmacy did not receive the script for the change and patient is now out of medication.  Patient needs script [facility] [address]. Noted that approval given per Dr. Caryl Never approved change however no indication of quantity or number of refills so unable to call in.    OFFICE NOTE: PLEASE FOLLOW UP WITH PATIENT AND PHARMACY ON SCRIPT REQUEST.

## 2012-06-09 ENCOUNTER — Encounter: Payer: Self-pay | Admitting: Family Medicine

## 2012-06-09 ENCOUNTER — Ambulatory Visit (INDEPENDENT_AMBULATORY_CARE_PROVIDER_SITE_OTHER): Payer: 59 | Admitting: Family Medicine

## 2012-06-09 VITALS — BP 110/70 | Temp 98.6°F | Wt 219.0 lb

## 2012-06-09 DIAGNOSIS — I10 Essential (primary) hypertension: Secondary | ICD-10-CM

## 2012-06-09 DIAGNOSIS — J45909 Unspecified asthma, uncomplicated: Secondary | ICD-10-CM

## 2012-06-09 DIAGNOSIS — E119 Type 2 diabetes mellitus without complications: Secondary | ICD-10-CM

## 2012-06-09 DIAGNOSIS — J4540 Moderate persistent asthma, uncomplicated: Secondary | ICD-10-CM

## 2012-06-09 LAB — HM DIABETES FOOT EXAM: HM Diabetic Foot Exam: NORMAL

## 2012-06-09 MED ORDER — FLUTICASONE-SALMETEROL 250-50 MCG/DOSE IN AEPB: 1.0000 | INHALATION_SPRAY | Freq: Two times a day (BID) | RESPIRATORY_TRACT | Status: DC

## 2012-06-09 NOTE — Progress Notes (Signed)
  Subjective:    Patient ID: Sherry Gallagher, female    DOB: Oct 10, 1955, 57 y.o.   MRN: [id number]  HPI  Medical follow up  Patient has history of type 2 diabetes, hypertension, obesity, hyperlipidemia, asthma Requesting refills of Advair. Asthma is been well controlled. Diabetes with history of good control. Fasting blood sugars ranging 110-150. She has been less compliant with exercise over the winter. No symptoms of hyperglycemia. Eye exam in February normal. No history of any complications.   Hypertension treated with benazepril HCTZ. Blood pressure very well controlled  Past Medical History  Diagnosis Date  . Diabetes mellitus   . Asthma   . GERD (gastroesophageal reflux disease)   . Allergy   . Pneumonia    Past Surgical History  Procedure Laterality Date  . Abdominal hysterectomy    . Nasal sinus surgery      reports that she has never smoked. She does not have any smokeless tobacco history on file. She reports that she does not drink alcohol or use illicit drugs. family history includes Alcohol abuse in her father and Cancer in her maternal grandmother and paternal grandmother. Allergies  Allergen Reactions  . Penicillins      Review of Systems  Constitutional: Negative for fatigue and unexpected weight change.  Eyes: Negative for visual disturbance.  Respiratory: Negative for cough, chest tightness, shortness of breath and wheezing.   Cardiovascular: Negative for chest pain, palpitations and leg swelling.  Neurological: Negative for dizziness, seizures, syncope, weakness, light-headedness and headaches.       Objective:   Physical Exam  Constitutional: She appears well-developed and well-nourished.  HENT:  Mouth/Throat: Oropharynx is clear and moist.  Neck: Neck supple. No thyromegaly present.  Cardiovascular: Normal rate and regular rhythm.  Exam reveals no gallop.   No murmur heard. Pulmonary/Chest: Effort normal and breath sounds normal. No  respiratory distress. She has no wheezes. She has no rales.  Musculoskeletal: She exhibits no edema.  Lymphadenopathy:    She has no cervical adenopathy.  Skin:  Feet reveal no skin lesions. Good distal foot pulses. Good capillary refill. No calluses. Normal sensation with monofilament testing           Assessment & Plan:  #1 type 2 diabetes. Well controlled. Recheck A1c. Continue weight loss and exercise efforts # 2 hypertension well controlled #3 asthma. Stable. Refill Advair for one year

## 2012-06-13 NOTE — Progress Notes (Signed)
Quick Note:  Pt informed ______ 

## 2012-07-28 ENCOUNTER — Other Ambulatory Visit: Payer: Self-pay | Admitting: Family Medicine

## 2012-07-28 NOTE — Telephone Encounter (Signed)
I left a message on pt VM asking why she is requesting this med?  If UTI sx, may need OV

## 2012-08-24 ENCOUNTER — Other Ambulatory Visit: Payer: Self-pay | Admitting: Family Medicine

## 2012-08-25 NOTE — Telephone Encounter (Signed)
Please verify if OK to refill med, according to pt's records pt changed PCP.

## 2012-08-25 NOTE — Telephone Encounter (Signed)
Will forward to Dr Caryl Never

## 2012-08-27 NOTE — Telephone Encounter (Signed)
Refill for one year 

## 2012-08-29 ENCOUNTER — Encounter: Payer: Self-pay | Admitting: Obstetrics & Gynecology

## 2012-09-25 ENCOUNTER — Other Ambulatory Visit: Payer: Self-pay | Admitting: Obstetrics & Gynecology

## 2012-09-25 DIAGNOSIS — R928 Other abnormal and inconclusive findings on diagnostic imaging of breast: Secondary | ICD-10-CM

## 2012-10-11 ENCOUNTER — Ambulatory Visit
Admission: RE | Admit: 2012-10-11 | Discharge: 2012-10-11 | Disposition: A | Discharge status: Home / Self Care | Payer: 59 | Source: Ambulatory Visit | Attending: Obstetrics & Gynecology | Admitting: Obstetrics & Gynecology

## 2012-10-11 ENCOUNTER — Other Ambulatory Visit: Payer: Self-pay | Admitting: Obstetrics & Gynecology

## 2012-10-11 DIAGNOSIS — R928 Other abnormal and inconclusive findings on diagnostic imaging of breast: Secondary | ICD-10-CM

## 2012-10-25 ENCOUNTER — Ambulatory Visit
Admission: RE | Admit: 2012-10-25 | Discharge: 2012-10-25 | Disposition: A | Discharge status: Home / Self Care | Payer: 59 | Source: Ambulatory Visit | Attending: Obstetrics & Gynecology | Admitting: Obstetrics & Gynecology

## 2012-10-25 DIAGNOSIS — R928 Other abnormal and inconclusive findings on diagnostic imaging of breast: Secondary | ICD-10-CM

## 2012-11-01 ENCOUNTER — Ambulatory Visit (INDEPENDENT_AMBULATORY_CARE_PROVIDER_SITE_OTHER): Payer: 59 | Admitting: General Surgery

## 2012-11-01 ENCOUNTER — Ambulatory Visit (INDEPENDENT_AMBULATORY_CARE_PROVIDER_SITE_OTHER): Payer: 59 | Admitting: Family Medicine

## 2012-11-01 ENCOUNTER — Ambulatory Visit (INDEPENDENT_AMBULATORY_CARE_PROVIDER_SITE_OTHER): Payer: Self-pay | Admitting: General Surgery

## 2012-11-01 ENCOUNTER — Encounter (INDEPENDENT_AMBULATORY_CARE_PROVIDER_SITE_OTHER): Payer: Self-pay | Admitting: General Surgery

## 2012-11-01 ENCOUNTER — Encounter: Payer: Self-pay | Admitting: Family Medicine

## 2012-11-01 VITALS — BP 136/78 | HR 95 | Temp 98.5°F | Wt 220.0 lb

## 2012-11-01 DIAGNOSIS — R300 Dysuria: Secondary | ICD-10-CM

## 2012-11-01 DIAGNOSIS — L089 Local infection of the skin and subcutaneous tissue, unspecified: Secondary | ICD-10-CM

## 2012-11-01 DIAGNOSIS — IMO0002 Reserved for concepts with insufficient information to code with codable children: Secondary | ICD-10-CM

## 2012-11-01 DIAGNOSIS — D242 Benign neoplasm of left breast: Secondary | ICD-10-CM

## 2012-11-01 DIAGNOSIS — D249 Benign neoplasm of unspecified breast: Secondary | ICD-10-CM

## 2012-11-01 DIAGNOSIS — S50819A Abrasion of unspecified forearm, initial encounter: Secondary | ICD-10-CM

## 2012-11-01 LAB — POCT URINALYSIS DIPSTICK
Glucose, UA: NEGATIVE
Nitrite, UA: POSITIVE
Urobilinogen, UA: 0.2

## 2012-11-01 MED ORDER — NITROFURANTOIN MONOHYD MACRO 100 MG PO CAPS: 100.0000 mg | ORAL_CAPSULE | Freq: Two times a day (BID) | ORAL | Status: DC

## 2012-11-01 NOTE — Progress Notes (Signed)
Patient ID: Sherry Gallagher, female   DOB: 12/19/1955, 57 y.o.   MRN: [id number]  Chief Complaint  Patient presents with  . New Evaluation    lt br papilloma    HPI Sherry Gallagher is a 57 y.o. female.  Referred by Dr Sherry Gallagher HPI 57 year old female who had no complaints referable to either breast and underwent her regular screening mammogram this year. She does have a history of bilateral breast implants. She underwent a screening mammogram which showed some calcifications in the left breast. Magnification showed a small group of pleomorphic calcifications this and about 9 mm in the subareolar portion of the left breast. 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 She has a family history of breast cancer in both her paternal and maternal grandmother's. She comes in today to discuss her options following the core biopsy.  Past Medical History  Diagnosis Date  . Diabetes mellitus   . Asthma   . GERD (gastroesophageal reflux disease)   . Allergy   . Pneumonia     Past Surgical History  Procedure Laterality Date  . Abdominal hysterectomy    . Nasal sinus surgery      Family History  Problem Relation Age of Onset  . Alcohol abuse Father   . Cancer Maternal Grandmother     breast  . Cancer Paternal Grandmother     breast    Social History History  Substance Use Topics  . Smoking status: Never Smoker   . Smokeless tobacco: Never Used  . Alcohol Use: No    Allergies  Allergen Reactions  . Penicillins     Current Outpatient Prescriptions  Medication Sig Dispense Refill  . albuterol (PROVENTIL) (2.5 MG/3ML) 0.083% nebulizer solution Take 3 mLs (2.5 mg total) by nebulization daily as needed.  75 mL  3  . benazepril-hydrochlorthiazide (LOTENSIN HCT) 20-25 MG per tablet TAKE 1/2 TABLET BY MOUTH DAILY  30 tablet  11  . cetirizine (ZYRTEC) 10 MG tablet Take 10 mg by  mouth daily. Every other day       . Fluticasone-Salmeterol (ADVAIR) 250-50 MCG/DOSE AEPB Inhale 1 puff into the lungs every 12 (twelve) hours.  60 each  11  . LIFESCAN FINEPOINT LANCETS MISC USE AS DIRECTED  100 each  3  . metFORMIN (GLUCOPHAGE) 500 MG tablet Take 1 tablet (500 mg total) by mouth 2 (two) times daily with a meal.  180 tablet  3  . omeprazole (PRILOSEC) 10 MG capsule Take 10 mg by mouth daily.      . ONE TOUCH ULTRA TEST test strip USE AS DIRECTED  100 each  3  . pravastatin (PRAVACHOL) 40 MG tablet TAKE 1 TABLET BY MOUTH DAILY  90 tablet  3  . fluticasone (FLONASE) 50 MCG/ACT nasal spray Place 2 sprays into the nose daily.  16 g  6  . ondansetron (ZOFRAN) 8 MG tablet Take 1 tablet (8 mg total) by mouth every 8 (eight) hours as needed for nausea.  5 tablet  0   No current facility-administered medications for this visit.    Review of Systems Review of Systems  Constitutional: Negative for fever, chills and unexpected weight change.  HENT: Negative for hearing loss, congestion, sore throat, trouble swallowing and voice change.   Eyes: Negative for visual disturbance.  Respiratory: Negative for cough and wheezing.   Cardiovascular: Negative for chest pain, palpitations and leg swelling.  Gastrointestinal: Negative for nausea,  vomiting, abdominal pain, diarrhea, constipation, blood in stool, abdominal distention and anal bleeding.  Genitourinary: Negative for hematuria, vaginal bleeding and difficulty urinating.  Musculoskeletal: Negative for arthralgias.  Skin: Negative for rash and wound.  Neurological: Negative for seizures, syncope and headaches.  Hematological: Negative for adenopathy. Does not bruise/bleed easily.  Psychiatric/Behavioral: Negative for confusion.    There were no vitals taken for this visit.  Physical Exam Physical Exam  Vitals reviewed. Constitutional: She appears well-developed and well-nourished.  Eyes: No scleral icterus.  Neck: Neck supple.    Cardiovascular: Normal rate, regular rhythm and normal heart sounds.   Pulmonary/Chest: Effort normal and breath sounds normal. She has no wheezes. Right breast exhibits no inverted nipple, no mass, no nipple discharge, no skin change and no tenderness. Left breast exhibits no inverted nipple, no mass, no nipple discharge, no skin change and no tenderness.    Lymphadenopathy:    She has no cervical adenopathy.    Data Reviewed DIGITAL DIAGNOSTIC LEFT BREAST MAMMOGRAM  Comparison: 09/18/2012, 09/15/2011, and 07/14/2004 film screen  mammogram.  Findings:  ACR Breast Density Category b: There are scattered areas of  fibroglandular density.  Magnification views of the left breast demonstrate a small group of  pleomorphic calcifications to be present within the superior  subareolar portion of the left breast. These span 9 mm. These are  slightly suspicious for DCIS (versus dystrophic calcifications).  Tissue sampling is recommended. I have discussed stereotactic core  biopsy with the patient. This will be scheduled per patient  preference.  IMPRESSION:  Small group of slightly suspicious calcifications located within  the subareolar portion of the left breast. Tissue sampling is  recommended and stereotactic core biopsy will be scheduled.   Assessment    Left breast papilloma    Plan    Left breast wire-guided excision  We discussed options including observation with followup imaging. We also discussed wire-guided excision. We have decided to proceed with wire-guided excision. We discussed the placement of the wire as well as the surgery. We discussed the risks of surgery as well as possible further evaluation surgery if this ends up being a cancer. I think the chance of this is very unlikely. We'll plan on doing this the next couple weeks.       Sherry Gallagher 11/01/2012, 9:23 AM

## 2012-11-01 NOTE — Patient Instructions (Addendum)
Urinary Tract Infection  Urinary tract infections (UTIs) can develop anywhere along your urinary tract. Your urinary tract is your body's drainage system for removing wastes and extra water. Your urinary tract includes two kidneys, two ureters, a bladder, and a urethra. Your kidneys are a pair of bean-shaped organs. Each kidney is about the size of your fist. They are located below your ribs, one on each side of your spine.  CAUSES  Infections are caused by microbes, which are microscopic organisms, including fungi, viruses, and bacteria. These organisms are so small that they can only be seen through a microscope. Bacteria are the microbes that most commonly cause UTIs.  SYMPTOMS   Symptoms of UTIs may vary by age and gender of the patient and by the location of the infection. Symptoms in young women typically include a frequent and intense urge to urinate and a painful, burning feeling in the bladder or urethra during urination. Older women and men are more likely to be tired, shaky, and weak and have muscle aches and abdominal pain. A fever may mean the infection is in your kidneys. Other symptoms of a kidney infection include pain in your back or sides below the ribs, nausea, and vomiting.  DIAGNOSIS  To diagnose a UTI, your caregiver will ask you about your symptoms. Your caregiver also will ask to provide a urine sample. The urine sample will be tested for bacteria and white blood cells. White blood cells are made by your body to help fight infection.  TREATMENT   Typically, UTIs can be treated with medication. Because most UTIs are caused by a bacterial infection, they usually can be treated with the use of antibiotics. The choice of antibiotic and length of treatment depend on your symptoms and the type of bacteria causing your infection.  HOME CARE INSTRUCTIONS   If you were prescribed antibiotics, take them exactly as your caregiver instructs you. Finish the medication even if you feel better after you  have only taken some of the medication.   Drink enough water and fluids to keep your urine clear or pale yellow.   Avoid caffeine, tea, and carbonated beverages. They tend to irritate your bladder.   Empty your bladder often. Avoid holding urine for long periods of time.   Empty your bladder before and after sexual intercourse.   After a bowel movement, women should cleanse from front to back. Use each tissue only once.  SEEK MEDICAL CARE IF:    You have back pain.   You develop a fever.   Your symptoms do not begin to resolve within 3 days.  SEEK IMMEDIATE MEDICAL CARE IF:    You have severe back pain or lower abdominal pain.   You develop chills.   You have nausea or vomiting.   You have continued burning or discomfort with urination.  MAKE SURE YOU:    Understand these instructions.   Will watch your condition.   Will get help right away if you are not doing well or get worse.  Document Released: 11/04/2004 Document Revised: 07/27/2011 Document Reviewed: 03/05/2011  [employer] Patient Information 2014 [employer]

## 2012-11-01 NOTE — Progress Notes (Signed)
  Subjective:    Patient ID: Sherry Gallagher, female    DOB: 31-Oct-1955, 57 y.o.   MRN: [id number]  HPI   patient seen with the following issues  Onset this morning of urine frequency and burning with urination. Denies any flank pain. No fevers or chills. No nausea or vomiting. She has previously taken Macrobid without difficulty. No gross hematuria.  She had recent left breast mass with papilloma on biopsy. She has scheduled excision next week.  Patient just this past Monday noticed abnormal area right forearm. No history of injury. No burn. No abrasion. Nonpainful. Not enlarging.   Small "pimple" right side of nose.  Nonpainful.  Past Medical History  Diagnosis Date  . Diabetes mellitus   . Asthma   . GERD (gastroesophageal reflux disease)   . Allergy   . Pneumonia    Past Surgical History  Procedure Laterality Date  . Abdominal hysterectomy    . Nasal sinus surgery      reports that she has never smoked. She has never used smokeless tobacco. She reports that she does not drink alcohol or use illicit drugs. family history includes Alcohol abuse in her father; Cancer in her maternal grandmother and paternal grandmother. Allergies  Allergen Reactions  . Penicillins      Review of Systems  Constitutional: Negative for fever and chills.  Eyes: Negative for visual disturbance.  Respiratory: Negative for cough and shortness of breath.   Cardiovascular: Negative for chest pain.  Genitourinary: Positive for dysuria and frequency.  Skin: Positive for rash.       Objective:   Physical Exam  Constitutional: She appears well-developed and well-nourished.  Cardiovascular: Normal rate and regular rhythm.   Pulmonary/Chest: Effort normal and breath sounds normal. No respiratory distress. She has no wheezes. She has no rales.  Skin:  Right forearm reveals a linear are somewhat oblique shape area of ?abrasion/burn 1 cm long and less than 1/2 cm wide. Erythematous base and  appears to be abrasion or superficial burn  Small 2mm white pustule right nose with mild surrounding erythema.          Assessment & Plan:  #1 uncomplicated cystitis. Macrobid one twice a day for 5 days. #2 right forearm lesion/rash. This appears clinically more compatible with abrasion or superficial burn. No secondary infection. Reassurance and follow #3 pustule right nose.  Warm compresses and let us know if not resolving next few days.

## 2012-11-06 ENCOUNTER — Encounter (HOSPITAL_COMMUNITY): Payer: Self-pay | Admitting: Pharmacy Technician

## 2012-11-08 ENCOUNTER — Encounter (HOSPITAL_COMMUNITY): Payer: Self-pay

## 2012-11-08 ENCOUNTER — Encounter (HOSPITAL_COMMUNITY)
Admission: RE | Admit: 2012-11-08 | Discharge: 2012-11-08 | Disposition: A | Discharge status: Home / Self Care | Payer: 59 | Source: Ambulatory Visit | Attending: Anesthesiology | Admitting: Anesthesiology

## 2012-11-08 ENCOUNTER — Encounter (HOSPITAL_COMMUNITY)
Admission: RE | Admit: 2012-11-08 | Discharge: 2012-11-08 | Disposition: A | Discharge status: Home / Self Care | Payer: 59 | Source: Ambulatory Visit | Attending: General Surgery | Admitting: General Surgery

## 2012-11-08 LAB — BASIC METABOLIC PANEL
BUN: 20 mg/dL (ref 6–23)
CO2: 26 mEq/L (ref 19–32)
GFR calc non Af Amer: >90 mL/min (ref >90)
Glucose, Bld: 97 mg/dL (ref 70–99)
Potassium: 4.4 mEq/L (ref 3.5–5.1)
Sodium: 138 mEq/L (ref 135–145)

## 2012-11-08 LAB — CBC
HCT: 42.7 % (ref 36.0–46.0)
Hemoglobin: 14.7 g/dL (ref 12.0–15.0)
MCH: 31.7 pg (ref 26.0–34.0)
MCV: 92.2 fL (ref 78.0–100.0)
Platelets: 478 10*3/uL — ABNORMAL HIGH (ref 150–400)
RBC: 4.63 MIL/uL (ref 3.87–5.11)
WBC: 10.2 10*3/uL (ref 4.0–10.5)

## 2012-11-08 NOTE — Pre-Procedure Instructions (Signed)
Sherry Gallagher  11/08/2012   Your procedure is scheduled on:  Friday, October 3rd  Report to Main Entrance "A" once finished at breast center for wire placement.  Call this number if you have problems the morning of surgery: [phone]   Remember:   Do not eat food or drink liquids after midnight.   Take these medicines the morning of surgery with A SIP OF WATER: nexium, prilosec, flonase, advair, albuterol if needed   Do not wear jewelry, make-up or nail polish.  Do not wear lotions, powders, or perfume,deodorant.  Do not shave 48 hours prior to surgery. Men may shave face and neck.  Do not bring valuables to the hospital.  [facility] is not responsible  for any belongings or valuables.               Contacts, dentures or bridgework may not be worn into surgery.  Leave suitcase in the car. After surgery it may be brought to your room.  For patients admitted to the hospital, discharge time is determined by your  treatment team.               Patients discharged the day of surgery will not be allowed to drive home.    Special Instructions: Shower using CHG 2 nights before surgery and the night before surgery.  If you shower the day of surgery use CHG.  Use special wash - you have one bottle of CHG for all showers.  You should use approximately 1/3 of the bottle for each shower.   Please read over the following fact sheets that you were given: Pain Booklet, Coughing and Deep Breathing and Surgical Site Infection Prevention

## 2012-11-08 NOTE — Progress Notes (Signed)
Primary physician - dr. Docia Furl - [place] Cardiologist - none No recent ekg. No other previous cardiac testing

## 2012-11-09 ENCOUNTER — Other Ambulatory Visit: Payer: Self-pay | Admitting: Family Medicine

## 2012-11-09 MED ORDER — VANCOMYCIN HCL IN DEXTROSE 1-5 GM/200ML-% IV SOLN
1000.0000 mg | INTRAVENOUS | Status: AC
  Administered 2012-11-10: 1000 mg via INTRAVENOUS
  Filled 2012-11-09: qty 200

## 2012-11-10 ENCOUNTER — Encounter (HOSPITAL_COMMUNITY): Payer: Self-pay | Admitting: *Deleted

## 2012-11-10 ENCOUNTER — Encounter (HOSPITAL_COMMUNITY)
Admission: RE | Disposition: A | Discharge status: Home / Self Care | Payer: Self-pay | Source: Ambulatory Visit | Attending: General Surgery

## 2012-11-10 ENCOUNTER — Ambulatory Visit
Admission: RE | Admit: 2012-11-10 | Discharge: 2012-11-10 | Disposition: A | Discharge status: Home / Self Care | Payer: 59 | Source: Ambulatory Visit | Attending: General Surgery | Admitting: General Surgery

## 2012-11-10 ENCOUNTER — Ambulatory Visit (HOSPITAL_COMMUNITY): Payer: 59 | Admitting: Anesthesiology

## 2012-11-10 ENCOUNTER — Encounter (HOSPITAL_COMMUNITY): Payer: Self-pay | Admitting: Anesthesiology

## 2012-11-10 ENCOUNTER — Ambulatory Visit (HOSPITAL_COMMUNITY)
Admission: RE | Admit: 2012-11-10 | Discharge: 2012-11-10 | Disposition: A | Discharge status: Home / Self Care | Payer: 59 | Source: Ambulatory Visit | Attending: General Surgery | Admitting: General Surgery

## 2012-11-10 DIAGNOSIS — K219 Gastro-esophageal reflux disease without esophagitis: Secondary | ICD-10-CM | POA: Insufficient documentation

## 2012-11-10 DIAGNOSIS — E119 Type 2 diabetes mellitus without complications: Secondary | ICD-10-CM | POA: Insufficient documentation

## 2012-11-10 DIAGNOSIS — D242 Benign neoplasm of left breast: Secondary | ICD-10-CM

## 2012-11-10 DIAGNOSIS — N6019 Diffuse cystic mastopathy of unspecified breast: Secondary | ICD-10-CM

## 2012-11-10 DIAGNOSIS — I10 Essential (primary) hypertension: Secondary | ICD-10-CM | POA: Insufficient documentation

## 2012-11-10 DIAGNOSIS — R920 Mammographic microcalcification found on diagnostic imaging of breast: Secondary | ICD-10-CM

## 2012-11-10 DIAGNOSIS — D249 Benign neoplasm of unspecified breast: Secondary | ICD-10-CM | POA: Insufficient documentation

## 2012-11-10 DIAGNOSIS — Z978 Presence of other specified devices: Secondary | ICD-10-CM | POA: Insufficient documentation

## 2012-11-10 HISTORY — PX: BREAST BIOPSY: SHX20

## 2012-11-10 LAB — GLUCOSE, CAPILLARY
Glucose-Capillary: 115 mg/dL — ABNORMAL HIGH (ref 70–99)
Glucose-Capillary: 122 mg/dL — ABNORMAL HIGH (ref 70–99)

## 2012-11-10 SURGERY — BREAST BIOPSY WITH NEEDLE LOCALIZATION
Anesthesia: General | Laterality: Left | Wound class: Clean

## 2012-11-10 MED ORDER — OXYCODONE HCL 5 MG PO TABS: 5.0000 mg | ORAL_TABLET | ORAL | Status: DC | PRN

## 2012-11-10 MED ORDER — PROPOFOL 10 MG/ML IV BOLUS
INTRAVENOUS | Status: DC | PRN
  Administered 2012-11-10: 200 mg via INTRAVENOUS

## 2012-11-10 MED ORDER — FENTANYL CITRATE 0.05 MG/ML IJ SOLN
INTRAMUSCULAR | Status: DC | PRN
  Administered 2012-11-10 (×3): 50 ug via INTRAVENOUS

## 2012-11-10 MED ORDER — BUPIVACAINE HCL (PF) 0.25 % IJ SOLN
INTRAMUSCULAR | Status: AC
  Filled 2012-11-10: qty 30

## 2012-11-10 MED ORDER — 0.9 % SODIUM CHLORIDE (POUR BTL) OPTIME
TOPICAL | Status: DC | PRN
  Administered 2012-11-10: 1000 mL

## 2012-11-10 MED ORDER — LIDOCAINE HCL (CARDIAC) 20 MG/ML IV SOLN
INTRAVENOUS | Status: DC | PRN
  Administered 2012-11-10: 100 mg via INTRAVENOUS

## 2012-11-10 MED ORDER — OXYCODONE-ACETAMINOPHEN 10-325 MG PO TABS: 1.0000 | ORAL_TABLET | Freq: Four times a day (QID) | ORAL | Status: DC | PRN

## 2012-11-10 MED ORDER — BUPIVACAINE-EPINEPHRINE PF 0.25-1:200000 % IJ SOLN
INTRAMUSCULAR | Status: AC
  Filled 2012-11-10: qty 30

## 2012-11-10 MED ORDER — MIDAZOLAM HCL 5 MG/5ML IJ SOLN
INTRAMUSCULAR | Status: DC | PRN
  Administered 2012-11-10: 2 mg via INTRAVENOUS

## 2012-11-10 MED ORDER — ONDANSETRON HCL 4 MG/2ML IJ SOLN
INTRAMUSCULAR | Status: DC | PRN
  Administered 2012-11-10: 4 mg via INTRAVENOUS

## 2012-11-10 MED ORDER — HYDROMORPHONE HCL PF 1 MG/ML IJ SOLN: 0.2500 mg | INTRAMUSCULAR | Status: DC | PRN

## 2012-11-10 MED ORDER — LACTATED RINGERS IV SOLN
INTRAVENOUS | Status: DC | PRN
  Administered 2012-11-10: 09:00:00 via INTRAVENOUS

## 2012-11-10 MED ORDER — BUPIVACAINE HCL 0.25 % IJ SOLN
INTRAMUSCULAR | Status: DC | PRN
  Administered 2012-11-10: 10 mL

## 2012-11-10 MED ORDER — HYDROMORPHONE HCL PF 1 MG/ML IJ SOLN
0.2500 mg | INTRAMUSCULAR | Status: DC | PRN
Start: 2012-11-10 — End: 2012-11-10

## 2012-11-10 MED ORDER — PHENYLEPHRINE HCL 10 MG/ML IJ SOLN
INTRAMUSCULAR | Status: DC | PRN
  Administered 2012-11-10: 80 ug via INTRAVENOUS

## 2012-11-10 SURGICAL SUPPLY — 40 items
ADH SKN CLS APL DERMABOND .7 (GAUZE/BANDAGES/DRESSINGS) ×1
APPLIER CLIP 9.375 MED OPEN (MISCELLANEOUS)
APR CLP MED 9.3 20 MLT OPN (MISCELLANEOUS)
BINDER BREAST LRG (GAUZE/BANDAGES/DRESSINGS) IMPLANT
BINDER BREAST XLRG (GAUZE/BANDAGES/DRESSINGS) IMPLANT
CANISTER SUCTION 2500CC (MISCELLANEOUS) IMPLANT
CHLORAPREP W/TINT 26ML (MISCELLANEOUS) ×2 IMPLANT
CLIP APPLIE 9.375 MED OPEN (MISCELLANEOUS) IMPLANT
CLOTH BEACON ORANGE TIMEOUT ST (SAFETY) ×2 IMPLANT
COVER SURGICAL LIGHT HANDLE (MISCELLANEOUS) ×2 IMPLANT
DECANTER SPIKE VIAL GLASS SM (MISCELLANEOUS) ×2 IMPLANT
DERMABOND ADVANCED (GAUZE/BANDAGES/DRESSINGS) ×1
DERMABOND ADVANCED .7 DNX12 (GAUZE/BANDAGES/DRESSINGS) IMPLANT
DEVICE DUBIN SPECIMEN MAMMOGRA (MISCELLANEOUS) ×2 IMPLANT
DRAPE CHEST BREAST 15X10 FENES (DRAPES) ×2 IMPLANT
ELECT CAUTERY BLADE 6.4 (BLADE) ×2 IMPLANT
ELECT REM PT RETURN 9FT ADLT (ELECTROSURGICAL) ×2
ELECTRODE REM PT RTRN 9FT ADLT (ELECTROSURGICAL) ×1 IMPLANT
GLOVE BIO SURGEON STRL SZ7 (GLOVE) ×2 IMPLANT
GLOVE BIOGEL PI IND STRL 7.5 (GLOVE) ×1 IMPLANT
GLOVE BIOGEL PI INDICATOR 7.5 (GLOVE) ×1
GOWN STRL NON-REIN LRG LVL3 (GOWN DISPOSABLE) ×4 IMPLANT
KIT BASIN OR (CUSTOM PROCEDURE TRAY) ×2 IMPLANT
KIT MARKER MARGIN INK (KITS) IMPLANT
KIT ROOM TURNOVER OR (KITS) ×2 IMPLANT
NDL HYPO 25GX1X1/2 BEV (NEEDLE) ×1 IMPLANT
NEEDLE HYPO 25GX1X1/2 BEV (NEEDLE) ×4 IMPLANT
NS IRRIG 1000ML POUR BTL (IV SOLUTION) ×2 IMPLANT
PACK GENERAL/GYN (CUSTOM PROCEDURE TRAY) ×2 IMPLANT
PAD ARMBOARD 7.5X6 YLW CONV (MISCELLANEOUS) ×2 IMPLANT
STAPLER VISISTAT 35W (STAPLE) ×2 IMPLANT
STRIP CLOSURE SKIN 1/2X4 (GAUZE/BANDAGES/DRESSINGS) ×2 IMPLANT
SUT MNCRL AB 4-0 PS2 18 (SUTURE) ×2 IMPLANT
SUT SILK 2 0 SH (SUTURE) IMPLANT
SUT VIC AB 2-0 SH 27 (SUTURE) ×2
SUT VIC AB 2-0 SH 27XBRD (SUTURE) ×1 IMPLANT
SUT VIC AB 3-0 SH 18 (SUTURE) ×2 IMPLANT
SYR CONTROL 10ML LL (SYRINGE) ×3 IMPLANT
TOWEL OR 17X24 6PK STRL BLUE (TOWEL DISPOSABLE) ×2 IMPLANT
TOWEL OR 17X26 10 PK STRL BLUE (TOWEL DISPOSABLE) ×2 IMPLANT

## 2012-11-10 NOTE — Transfer of Care (Signed)
Immediate Anesthesia Transfer of Care Note  Patient: Sherry Gallagher  Procedure(s) Performed: Procedure(s): BREAST BIOPSY WITH NEEDLE LOCALIZATION (Left)  Patient Location: PACU  Anesthesia Type:General  Level of Consciousness: awake, alert  and oriented  Airway & Oxygen Therapy: Patient Spontanous Breathing and Patient connected to nasal cannula oxygen  Post-op Assessment: Report given to PACU RN and Post -op Vital signs reviewed and stable  Post vital signs: Reviewed and stable  Complications: No apparent anesthesia complications

## 2012-11-10 NOTE — Anesthesia Preprocedure Evaluation (Addendum)
Anesthesia Evaluation  Patient identified by MRN, date of birth, ID band Patient awake    Reviewed: Allergy & Precautions, H&P , NPO status , Patient's Chart, lab work & pertinent test results  Airway Mallampati: II      Dental   Pulmonary asthma , pneumonia -, resolved,  breath sounds clear to auscultation        Cardiovascular hypertension, Pt. on medications Rhythm:Regular Rate:Normal     Neuro/Psych    GI/Hepatic Neg liver ROS, GERD-  ,  Endo/Other  diabetes  Renal/GU negative Renal ROS     Musculoskeletal   Abdominal   Peds  Hematology   Anesthesia Other Findings   Reproductive/Obstetrics                          Anesthesia Physical Anesthesia Plan  ASA: III  Anesthesia Plan: General   Post-op Pain Management:    Induction: Intravenous  Airway Management Planned: Oral ETT  Additional Equipment:   Intra-op Plan:   Post-operative Plan: Extubation in OR  Informed Consent: I have reviewed the patients History and Physical, chart, labs and discussed the procedure including the risks, benefits and alternatives for the proposed anesthesia with the patient or authorized representative who has indicated his/her understanding and acceptance.   Dental advisory given  Plan Discussed with: CRNA and Anesthesiologist  Anesthesia Plan Comments:        Anesthesia Quick Evaluation

## 2012-11-10 NOTE — Anesthesia Procedure Notes (Signed)
Procedure Name: LMA Insertion Date/Time: 11/10/2012 10:07 AM Performed by: Marena Chancy Pre-anesthesia Checklist: Patient identified, Patient being monitored, Emergency Drugs available, Suction available and Timeout performed Patient Re-evaluated:Patient Re-evaluated prior to inductionOxygen Delivery Method: Circle system utilized Preoxygenation: Pre-oxygenation with 100% oxygen Intubation Type: IV induction LMA: LMA inserted LMA Size: 4.0 Number of attempts: 1 Placement Confirmation: breath sounds checked- equal and bilateral and positive ETCO2 Tube secured with: Tape Dental Injury: Teeth and Oropharynx as per pre-operative assessment

## 2012-11-10 NOTE — Preoperative (Signed)
Beta Blockers   Reason not to administer Beta Blockers:Not Applicable 

## 2012-11-10 NOTE — Anesthesia Postprocedure Evaluation (Signed)
  Anesthesia Post-op Note  Patient: Sherry Gallagher  Procedure(s) Performed: Procedure(s): BREAST BIOPSY WITH NEEDLE LOCALIZATION (Left)  Patient Location: PACU  Anesthesia Type:General  Level of Consciousness: awake  Airway and Oxygen Therapy: Patient Spontanous Breathing  Post-op Pain: mild  Post-op Assessment: Post-op Vital signs reviewed  Post-op Vital Signs: Reviewed  Complications: No apparent anesthesia complications

## 2012-11-10 NOTE — Interval H&P Note (Signed)
History and Physical Interval Note:  11/10/2012 9:41 AM  Sherry Gallagher  has presented today for surgery, with the diagnosis of left breast papalloma  The various methods of treatment have been discussed with the patient and family. After consideration of risks, benefits and other options for treatment, the patient has consented to  Procedure(s): BREAST BIOPSY WITH NEEDLE LOCALIZATION (Left) as a surgical intervention .  The patient's history has been reviewed, patient examined, no change in status, stable for surgery.  I have reviewed the patient's chart and labs.  Questions were answered to the patient's satisfaction.     Loriel Diehl

## 2012-11-10 NOTE — Op Note (Signed)
Preoperative diagnosis: Left breast papilloma Postoperative diagnosis: Same as above Procedure: Left breast wire guided excision Surgeon: Dr. Harden Mo Estimated blood loss: Minimal Complications: None Drains: None Anesthesia Gen. With LMA Specimens: Left breast tissue marked short stitch superior, long stitch lateral, double stitch deep Disposition to recovery stable Sponge and count correct  Indications: This 57 year old female with history of breast implants who presents after undergoing a screening mammogram with an abnormal subareolar mass and some calcifications. This underwent a core biopsy showing a papilloma. She was then referred for evaluation for excision. We had a discussion about the different options and eventually decided on an excision of this area with wire guidance.  Procedure: After informed as well as obtained the patient was taken to the operating room. She first had a wire placed at the breast Center. She was given vancomycin due to her penicillin allergy. She had sequential compression devices on her legs. She was then placed under general anesthesia with an LMA. She was prepped and draped in the standard sterile surgical fashion. A surgical timeout was performed.  I used a periareolar incision which encompassed some of her old incision from her implant. I then used cautery to dissect down to identify the wire. I brought this in from remote position. I then went behind the wire and excised this entire area. A Faxitron mammogram was taken confirming removal of the wire, clip, and abnormal area. I then obtained hemostasis. I then closed with 3-0 Vicryl. 5-0 Monocryl was used to close the skin. Dermabond and Steri-Strips are placed over the incision. Marcaine was infiltrated throughout the wound. She tolerated this well was extubated and transferred to recovery stable.

## 2012-11-10 NOTE — H&P (View-Only) (Signed)
Patient ID: Sherry Gallagher, female   DOB: [birth year], 57 y.o.   MRN: [id number]  Chief Complaint  Patient presents with  . New Evaluation    lt br papilloma    HPI Sherry Gallagher is a 57 y.o. female.  Referred by Dr Jean Rosenthal HPI 57 year old female who had no complaints referable to either breast and underwent her regular screening mammogram this year. She does have a history of bilateral breast implants. She underwent a screening mammogram which showed some calcifications in the left breast. Magnification showed a small group of pleomorphic calcifications this and about 9 mm in the subareolar portion of the left breast. 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 She has a family history of breast cancer in both her paternal and maternal grandmother's. She comes in today to discuss her options following the core biopsy.  Past Medical History  Diagnosis Date  . Diabetes mellitus   . Asthma   . GERD (gastroesophageal reflux disease)   . Allergy   . Pneumonia     Past Surgical History  Procedure Laterality Date  . Abdominal hysterectomy    . Nasal sinus surgery      Family History  Problem Relation Age of Onset  . Alcohol abuse Father   . Cancer Maternal Grandmother     breast  . Cancer Paternal Grandmother     breast    Social History History  Substance Use Topics  . Smoking status: Never Smoker   . Smokeless tobacco: Never Used  . Alcohol Use: No    Allergies  Allergen Reactions  . Penicillins     Current Outpatient Prescriptions  Medication Sig Dispense Refill  . albuterol (PROVENTIL) (2.5 MG/3ML) 0.083% nebulizer solution Take 3 mLs (2.5 mg total) by nebulization daily as needed.  75 mL  3  . benazepril-hydrochlorthiazide (LOTENSIN HCT) 20-25 MG per tablet TAKE 1/2 TABLET BY MOUTH DAILY  30 tablet  11  . cetirizine (ZYRTEC) 10 MG tablet Take 10 mg by  mouth daily. Every other day       . Fluticasone-Salmeterol (ADVAIR) 250-50 MCG/DOSE AEPB Inhale 1 puff into the lungs every 12 (twelve) hours.  60 each  11  . LIFESCAN FINEPOINT LANCETS MISC USE AS DIRECTED  100 each  3  . metFORMIN (GLUCOPHAGE) 500 MG tablet Take 1 tablet (500 mg total) by mouth 2 (two) times daily with a meal.  180 tablet  3  . omeprazole (PRILOSEC) 10 MG capsule Take 10 mg by mouth daily.      . ONE TOUCH ULTRA TEST test strip USE AS DIRECTED  100 each  3  . pravastatin (PRAVACHOL) 40 MG tablet TAKE 1 TABLET BY MOUTH DAILY  90 tablet  3  . fluticasone (FLONASE) 50 MCG/ACT nasal spray Place 2 sprays into the nose daily.  16 g  6  . ondansetron (ZOFRAN) 8 MG tablet Take 1 tablet (8 mg total) by mouth every 8 (eight) hours as needed for nausea.  5 tablet  0   No current facility-administered medications for this visit.    Review of Systems Review of Systems  Constitutional: Negative for fever, chills and unexpected weight change.  HENT: Negative for hearing loss, congestion, sore throat, trouble swallowing and voice change.   Eyes: Negative for visual disturbance.  Respiratory: Negative for cough and wheezing.   Cardiovascular: Negative for chest pain, palpitations and leg swelling.  Gastrointestinal: Negative for nausea,  vomiting, abdominal pain, diarrhea, constipation, blood in stool, abdominal distention and anal bleeding.  Genitourinary: Negative for hematuria, vaginal bleeding and difficulty urinating.  Musculoskeletal: Negative for arthralgias.  Skin: Negative for rash and wound.  Neurological: Negative for seizures, syncope and headaches.  Hematological: Negative for adenopathy. Does not bruise/bleed easily.  Psychiatric/Behavioral: Negative for confusion.    There were no vitals taken for this visit.  Physical Exam Physical Exam  Vitals reviewed. Constitutional: She appears well-developed and well-nourished.  Eyes: No scleral icterus.  Neck: Neck supple.    Cardiovascular: Normal rate, regular rhythm and normal heart sounds.   Pulmonary/Chest: Effort normal and breath sounds normal. She has no wheezes. Right breast exhibits no inverted nipple, no mass, no nipple discharge, no skin change and no tenderness. Left breast exhibits no inverted nipple, no mass, no nipple discharge, no skin change and no tenderness.    Lymphadenopathy:    She has no cervical adenopathy.    Data Reviewed DIGITAL DIAGNOSTIC LEFT BREAST MAMMOGRAM  Comparison: 09/18/2012, 09/15/2011, and 07/14/2004 film screen  mammogram.  Findings:  ACR Breast Density Category b: There are scattered areas of  fibroglandular density.  Magnification views of the left breast demonstrate a small group of  pleomorphic calcifications to be present within the superior  subareolar portion of the left breast. These span 9 mm. These are  slightly suspicious for DCIS (versus dystrophic calcifications).  Tissue sampling is recommended. I have discussed stereotactic core  biopsy with the patient. This will be scheduled per patient  preference.  IMPRESSION:  Small group of slightly suspicious calcifications located within  the subareolar portion of the left breast. Tissue sampling is  recommended and stereotactic core biopsy will be scheduled.   Assessment    Left breast papilloma    Plan    Left breast wire-guided excision  We discussed options including observation with followup imaging. We also discussed wire-guided excision. We have decided to proceed with wire-guided excision. We discussed the placement of the wire as well as the surgery. We discussed the risks of surgery as well as possible further evaluation surgery if this ends up being a cancer. I think the chance of this is very unlikely. We'll plan on doing this the next couple weeks.       Nayleen Janosik 11/01/2012, 9:23 AM

## 2012-11-11 IMAGING — CR DG FOOT COMPLETE 3+V*L*
3 series · 3 of 3 positions shown · non-contrast
Comparison: None.

CLINICAL DATA: Pain

LEFT FOOT - COMPLETE 3+ VIEW

[t foot ap left]
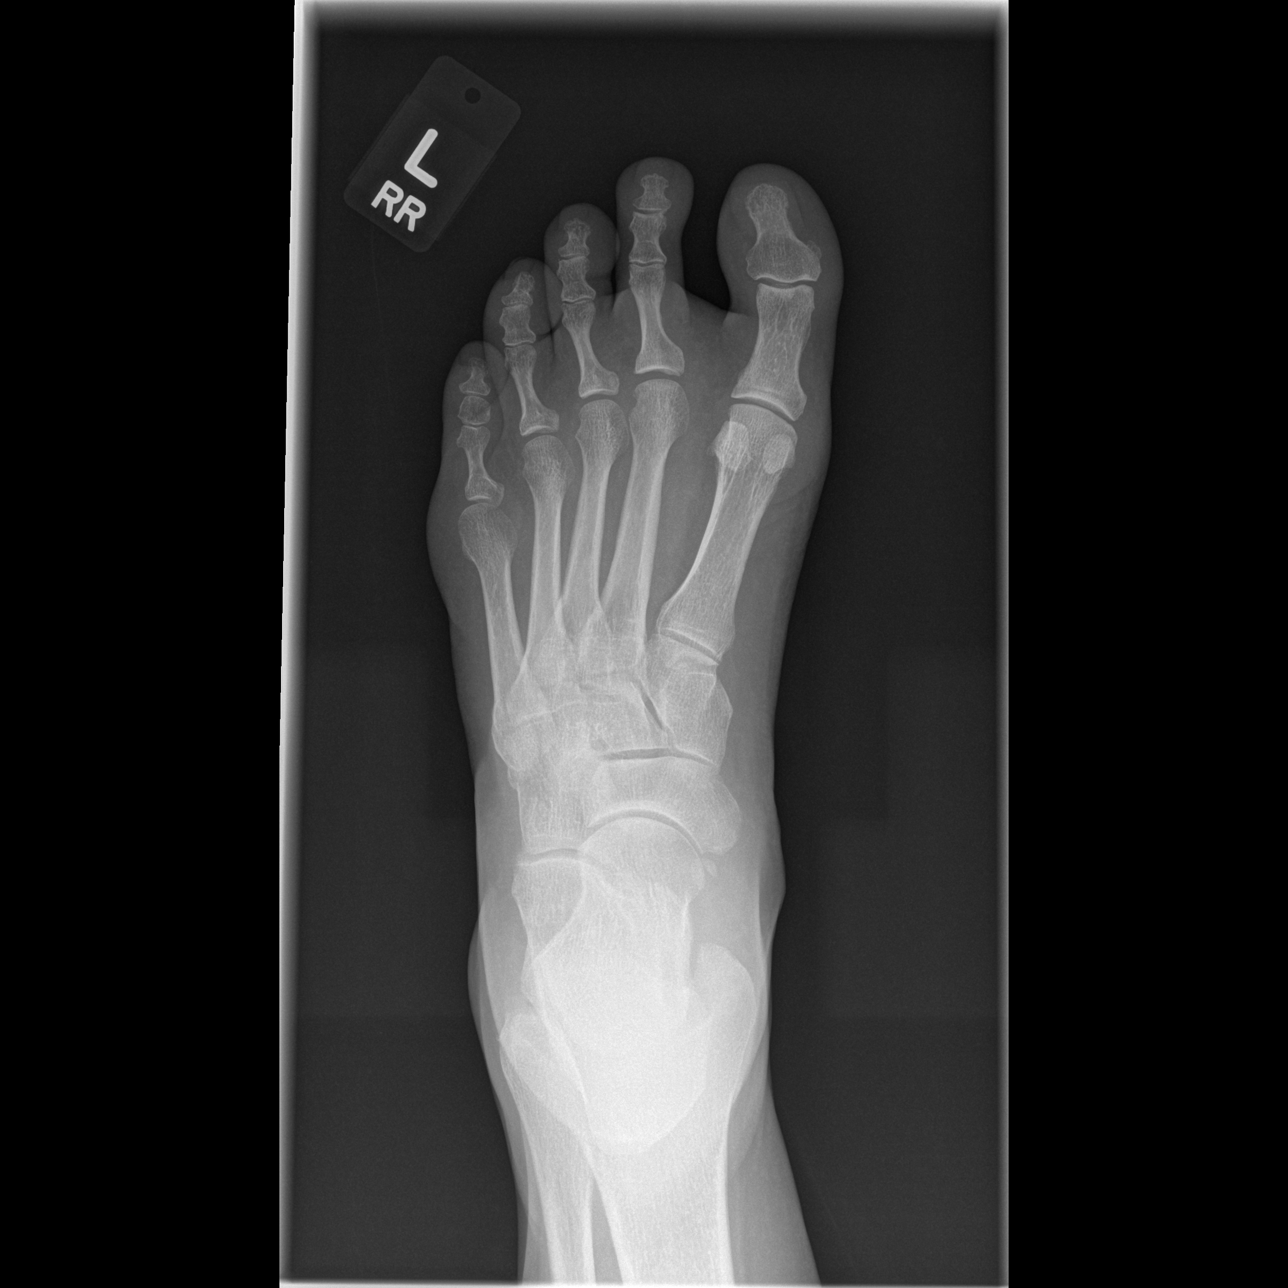

[t foot oblique left]
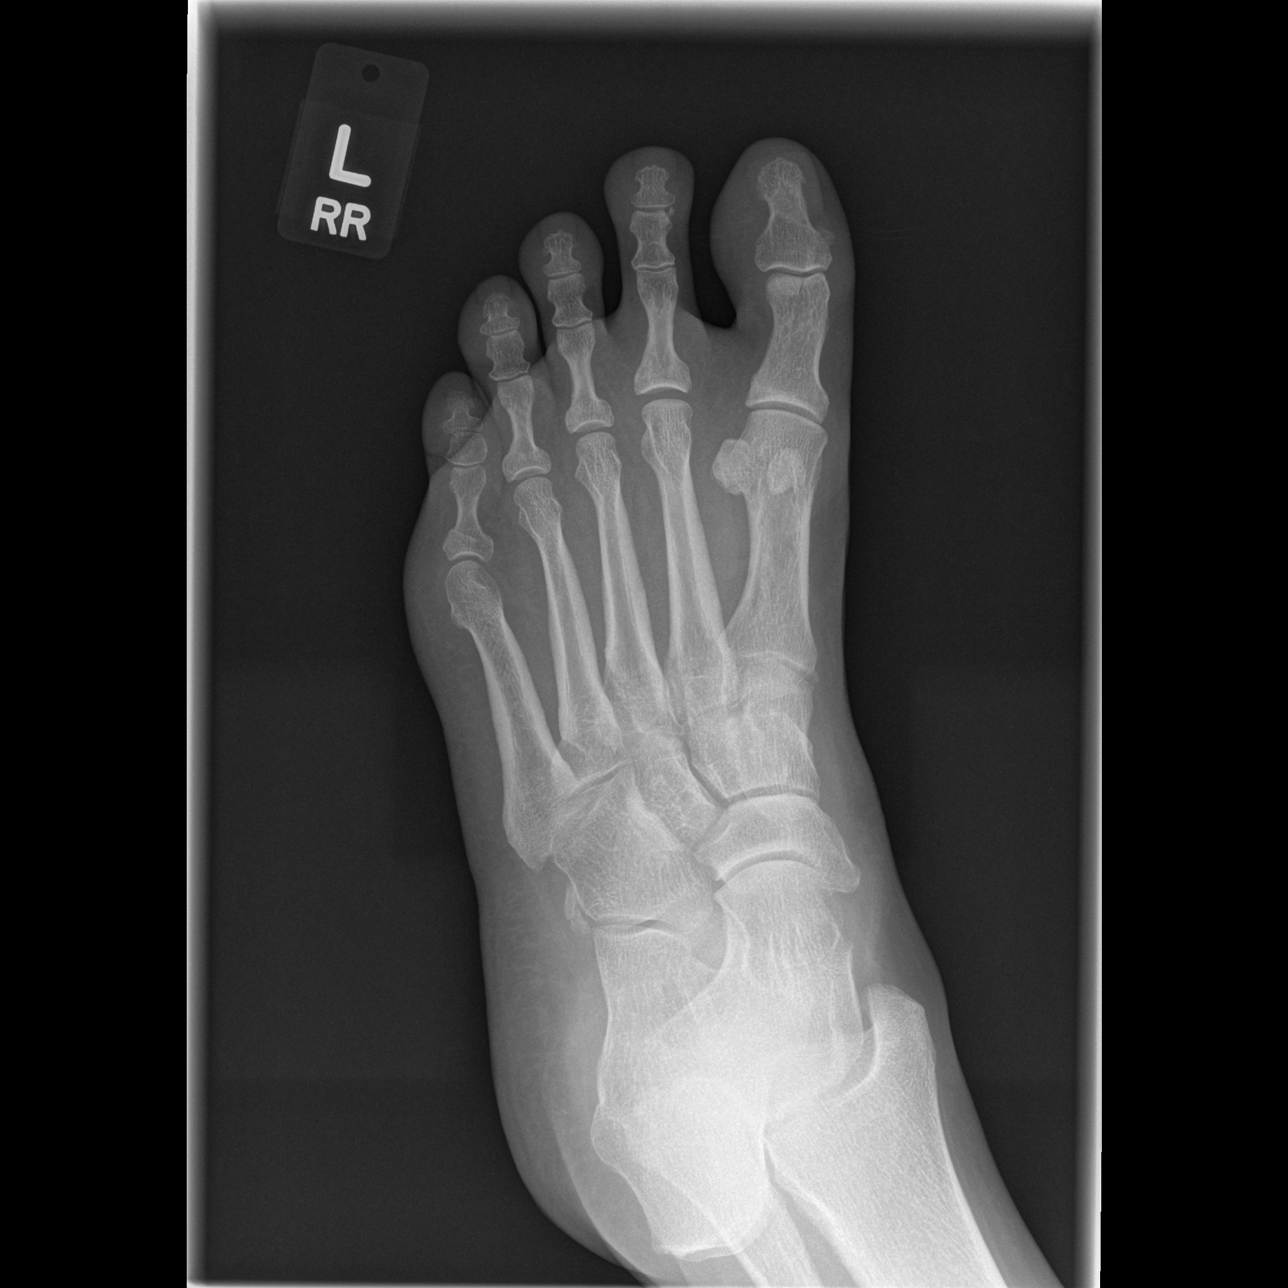

[t foot lat left]
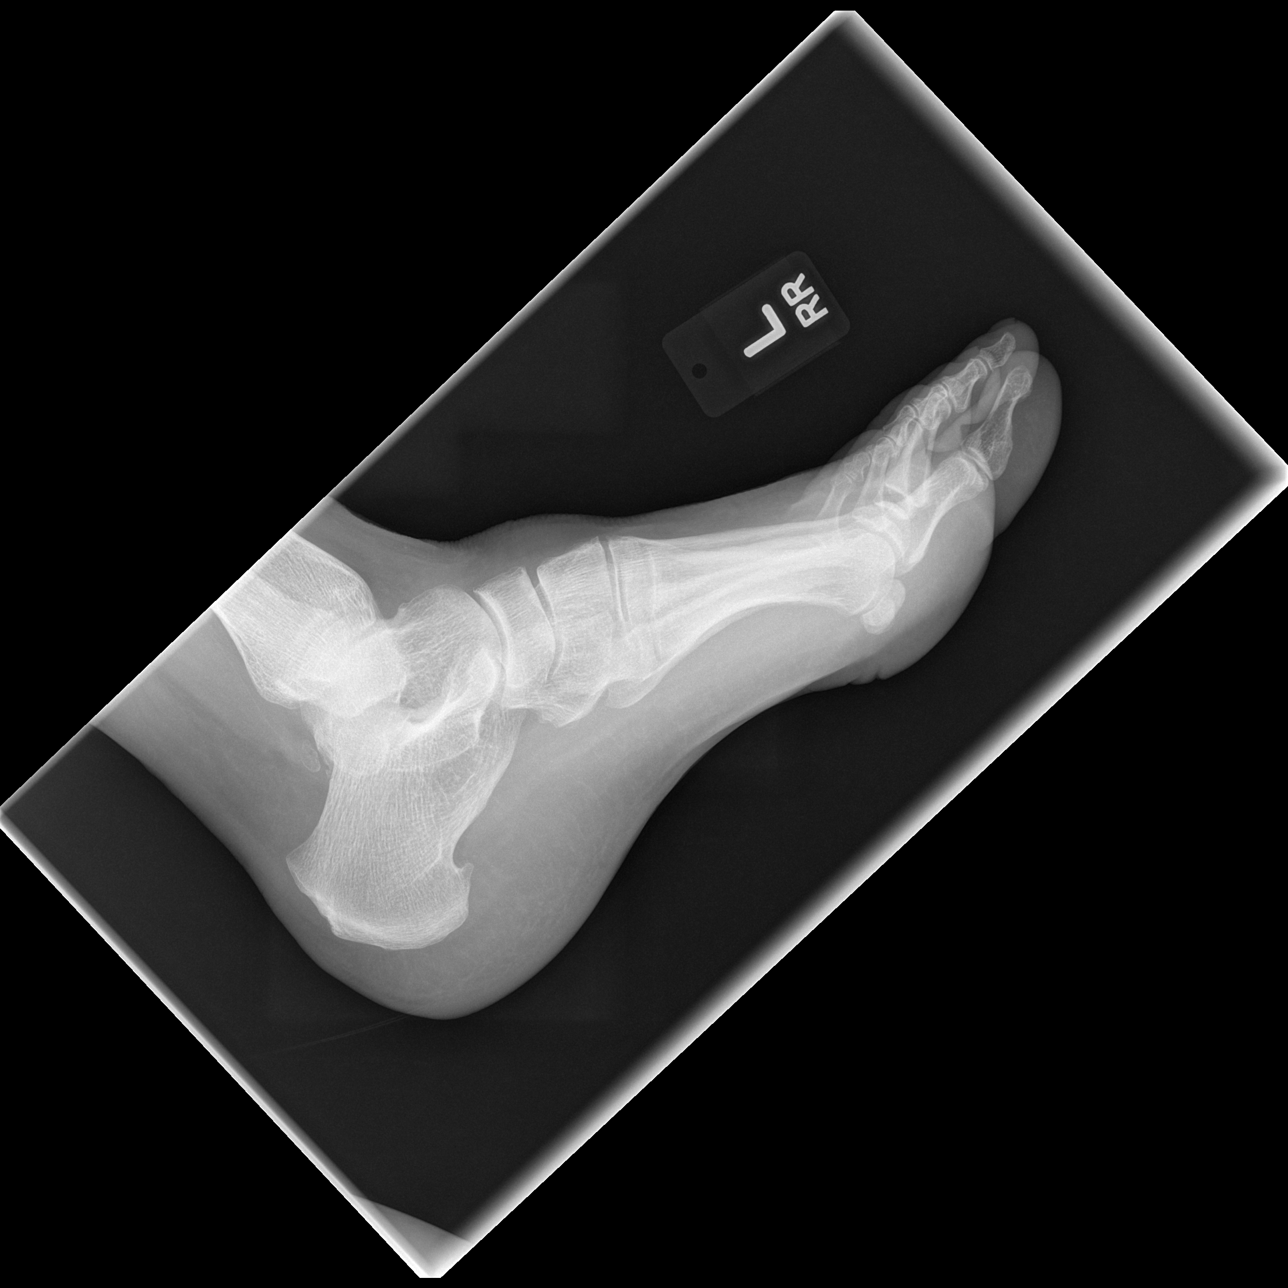

[3 of 3 positions shown; findings below may reference images not displayed]

FINDINGS: There is a tiny osseous density adjacent to the medial
aspect of the second DIP joint which could represent a small
avulsion from the distal aspect of the second middle phalanx.
There is no overlying soft tissue swelling, however.  The joint
compartments are maintained.  The mineralization is normal.  There
is a small plantar calcaneal spur.
IMPRESSION: Question old avulsion fracture from the medial distal second middle
phalanx.

Plantar calcaneal spur.

## 2012-11-14 ENCOUNTER — Encounter (HOSPITAL_COMMUNITY): Payer: Self-pay | Admitting: General Surgery

## 2012-11-24 ENCOUNTER — Ambulatory Visit (INDEPENDENT_AMBULATORY_CARE_PROVIDER_SITE_OTHER): Payer: 59 | Admitting: General Surgery

## 2012-11-24 ENCOUNTER — Encounter (INDEPENDENT_AMBULATORY_CARE_PROVIDER_SITE_OTHER): Payer: Self-pay | Admitting: General Surgery

## 2012-11-24 VITALS — BP 110/60 | HR 98 | Temp 98.0°F | Resp 16 | Ht 66.0 in | Wt 218.8 lb

## 2012-11-24 DIAGNOSIS — Z09 Encounter for follow-up examination after completed treatment for conditions other than malignant neoplasm: Secondary | ICD-10-CM

## 2012-11-24 NOTE — Progress Notes (Signed)
Subjective:     Patient ID: Sherry Gallagher, female   DOB: 1955/06/29, 57 y.o.   MRN: [id number]  HPI 27 yof who underwent left breast excisional biopsy for papilloma that was fibrocystic changes with calcs.  Specimen contained clip and wire.  She has done well and reports no complaints.  Review of Systems     Objective:   Physical Exam Healing left breast incision    Assessment:     S/p benign left breast biopsy     Plan:     She has done well and is without complaint.  She can return to full normal activity.  She is going to see her gyn once yearly for exam, continue annual mm and monthly sbe.  I will see back as needed

## 2012-11-24 NOTE — Patient Instructions (Signed)
Breast Self-Examination You should begin examining your breasts at age 57 even though the risk for breast cancer is low in this age group. It is important to become familiar with how your breasts look and feel. This is true for pregnant women, nursing mothers, women in menopause and women who have breast implants.  Women should examine their breasts once a month to look for changes and lumps. By doing monthly breast exams, you get to know how your breasts feel and how they can change from month to month. This allows you to pick up changes early. It can also offer you some reassurance that your breast health is good. This exam only takes minutes. Most breast lumps are not caused by cancer. If you find a lump, a special x-ray called a mammogram, or other tests may be needed to determine what is wrong.  Some of the signs that a breast lump is caused by cancer include:  Dimpling of the skin or changes in the shape of the breast or nipple.   A dark-colored or bloody discharge from the nipple.   Swollen lymph glands around the breast or in the armpit.   Redness of the breast or nipple.   Scaly nipple or skin on the breast.   Pain or swelling of the breast.  SELF-EXAM There are a few points to follow when doing a thorough breast exam. The best time to examine your breasts is 5 to 7 days after the menstrual period is over. During menstruation, the breasts are lumpier, and it may be more difficult to pick up changes. If you do not menstruate, have reached menopause or had a hysterectomy, examine your breasts the first day of every month. After three to four months, you will become more familiar with the variations of your breasts and more comfortable with the exam.  Perform your breast exam monthly. Keep a written record with breast changes or normal findings for each breast. This makes it easier to be sure of changes and to not solely depend on memory for size, tenderness, or location. Try to do the exam  at the same time each month, and write down where you are in your menstrual cycle if you are still menstruating.   Look at your breasts. Stand in front of a mirror with your hands clasped behind your head. Tighten your chest muscles and look for asymmetry. This means a difference in shape or contour from one breast to the other, such as puckers, dips or bumps. Look also for skin changes.   Lean forward with your hands on your hips. Again, look for symmetry and skin changes.   While showering, soap the breasts, and carefully feel the breasts with fingertips while holding the arm (on the side of the breast being examined) over the head. Do this with each breast carefully feeling for lumps or changes. Typically, a circular motion with moderate fingertip pressure should be used.   Repeat this exam while lying on your back, again with your arm behind your head and a pillow under your shoulders. Again, use your fingertips to examine both breasts, feeling for lumps and thickening. Begin at 1 o'clock and go clockwise around the whole breast.   At the end of your exam, gently squeeze each nipple to see if there is any drainage. Look for nipple changes, dimpling or redness.   Lastly, examine the upper chest and clavicle areas and in your armpits.  It is not necessary to become alarmed if you find   a breast lump. Most of them are not cancerous. However, it is necessary to see your caregiver if a lump is found in order to have it looked at. Document Released: 03/04/2004 Document Revised: 10/07/2010 Document Reviewed: 05/14/2008 Exit[employer] Patient Information 2012 [employer]. 

## 2012-12-05 ENCOUNTER — Other Ambulatory Visit: Payer: Self-pay | Admitting: Family Medicine

## 2012-12-06 NOTE — Telephone Encounter (Signed)
This is a Burchette pt.

## 2012-12-14 ENCOUNTER — Other Ambulatory Visit: Payer: Self-pay

## 2012-12-26 ENCOUNTER — Ambulatory Visit (INDEPENDENT_AMBULATORY_CARE_PROVIDER_SITE_OTHER): Payer: 59 | Admitting: Family Medicine

## 2012-12-26 ENCOUNTER — Encounter: Payer: Self-pay | Admitting: Family Medicine

## 2012-12-26 VITALS — BP 114/72 | HR 113 | Temp 98.2°F | Wt 200.0 lb

## 2012-12-26 DIAGNOSIS — J209 Acute bronchitis, unspecified: Secondary | ICD-10-CM

## 2012-12-26 MED ORDER — HYDROCODONE-HOMATROPINE 5-1.5 MG/5ML PO SYRP: 5.0000 mL | ORAL_SOLUTION | ORAL | Status: DC | PRN

## 2012-12-26 MED ORDER — ALBUTEROL SULFATE (2.5 MG/3ML) 0.083% IN NEBU: 2.5000 mg | INHALATION_SOLUTION | RESPIRATORY_TRACT | Status: DC | PRN

## 2012-12-26 MED ORDER — ALBUTEROL SULFATE HFA 108 (90 BASE) MCG/ACT IN AERS: 2.0000 | INHALATION_SPRAY | RESPIRATORY_TRACT | Status: DC | PRN

## 2012-12-26 MED ORDER — AZITHROMYCIN 250 MG PO TABS: ORAL_TABLET | ORAL | Status: DC

## 2012-12-26 NOTE — Progress Notes (Signed)
Pre visit review using our clinic review tool, if applicable. No additional management support is needed unless otherwise documented below in the visit note. 

## 2012-12-26 NOTE — Progress Notes (Signed)
  Subjective:    Patient ID: Sherry Gallagher, female    DOB: [birth year], 57 y.o.   MRN: [id number]  HPI Here for 5 days of chest tightness, a dry cough, and ST. No fever. On Sudafed.    Review of Systems  Constitutional: Negative.   HENT: Positive for congestion.   Eyes: Negative.   Respiratory: Positive for cough, chest tightness and wheezing.   Cardiovascular: Negative.        Objective:   Physical Exam  Constitutional: She appears well-developed and well-nourished. No distress.  HENT:  Right Ear: External ear normal.  Left Ear: External ear normal.  Nose: Nose normal.  Mouth/Throat: Oropharynx is clear and moist.  Eyes: Conjunctivae are normal.  Pulmonary/Chest: Effort normal. No respiratory distress. She has no rales.  Scattered wheezes and rhonchi   Lymphadenopathy:    She has no cervical adenopathy.          Assessment & Plan:  Bronchitis. Refilled her albuterol inhaler and albuterol for her nebulizer. Started on a Zpack. Add Mucinex.

## 2013-01-12 ENCOUNTER — Telehealth: Payer: Self-pay | Admitting: Family Medicine

## 2013-01-12 ENCOUNTER — Other Ambulatory Visit: Payer: Self-pay | Admitting: Family Medicine

## 2013-01-12 MED ORDER — COMFORT LANCETS MISC: Status: DC

## 2013-01-12 NOTE — Telephone Encounter (Signed)
Per Humberto Leep, RN   Patient Information:  Caller Name: [name]  Phone: [phone]  Patient: Sherry, Gallagher  Gender: Female  DOB: [birth year]  Age: 57 Years  PCP: Evelena Peat [facility])  Office Follow Up:  Does the office need to follow up with this patient?: Yes  Instructions For The Office: medication Refill  RN Note:  Patient is calling requesting a refill for her Ultra Soft Touch Lancets.  In addition she is requesting a Rx of Zofran ODT 8mg , #5, for occasional nausea. Last Rx of Zofran #5 have lasted her a year. Desires medication to be sent to [facility] [phone].  Symptoms  Reason For Call & Symptoms: requesting refill of Rx Ultra Soft Touch lancets.  Reviewed Health History In EMR: Yes  Reviewed Medications In EMR: Yes  Reviewed Allergies In EMR: Yes  Reviewed Surgeries / Procedures: Yes  Date of Onset of Symptoms: 01/12/2013  Guideline(s) Used:  No Protocol Available - Information Only  Disposition Per Guideline:   Discuss with PCP and Callback by Nurse Today  Reason For Disposition Reached:   Nursing judgment  Advice Given:  Call Back If:  New symptoms develop  Patient Will Follow Care Advice:  YES

## 2013-01-12 NOTE — Telephone Encounter (Signed)
Patient notified that her lancets will be sent to the pharmacy.  Will need to wait for Dr. Caryl Never on refill of Zofran for occasional use.  Pt has pills.  Not completely out.  Not current on her med list.

## 2013-01-14 NOTE — Telephone Encounter (Signed)
Refill Zofran ODT 8 mg one every 8 hours prn nausea and vomiting #6 with 1 refill.

## 2013-01-15 ENCOUNTER — Other Ambulatory Visit: Payer: Self-pay | Admitting: Family Medicine

## 2013-01-15 MED ORDER — ONDANSETRON HCL 8 MG PO TABS: 8.0000 mg | ORAL_TABLET | Freq: Three times a day (TID) | ORAL | Status: DC | PRN

## 2013-01-15 NOTE — Telephone Encounter (Signed)
Patient notified by telephone that rx was sent to the pharmacy.

## 2013-04-30 ENCOUNTER — Other Ambulatory Visit: Payer: Self-pay | Admitting: Family Medicine

## 2013-08-28 ENCOUNTER — Telehealth: Payer: Self-pay | Admitting: *Deleted

## 2013-08-28 DIAGNOSIS — E119 Type 2 diabetes mellitus without complications: Secondary | ICD-10-CM

## 2013-08-28 DIAGNOSIS — E785 Hyperlipidemia, unspecified: Secondary | ICD-10-CM

## 2013-08-28 NOTE — Telephone Encounter (Signed)
Patient coming in fasting for follow up DM Lipid, a1c, bmet, micro albumin ordered Diabetic bundle

## 2013-09-05 ENCOUNTER — Ambulatory Visit (INDEPENDENT_AMBULATORY_CARE_PROVIDER_SITE_OTHER): Payer: 59 | Admitting: Family Medicine

## 2013-09-05 ENCOUNTER — Encounter: Payer: Self-pay | Admitting: Family Medicine

## 2013-09-05 VITALS — BP 118/80 | HR 90 | Temp 98.3°F | Wt 232.0 lb

## 2013-09-05 DIAGNOSIS — E119 Type 2 diabetes mellitus without complications: Secondary | ICD-10-CM

## 2013-09-05 DIAGNOSIS — E785 Hyperlipidemia, unspecified: Secondary | ICD-10-CM

## 2013-09-05 DIAGNOSIS — I10 Essential (primary) hypertension: Secondary | ICD-10-CM

## 2013-09-05 DIAGNOSIS — E669 Obesity, unspecified: Secondary | ICD-10-CM | POA: Insufficient documentation

## 2013-09-05 LAB — BASIC METABOLIC PANEL
BUN: 16 mg/dL (ref 6–23)
CHLORIDE: 104 meq/L (ref 96–112)
CO2: 28 mEq/L (ref 19–32)
CREATININE: 0.7 mg/dL (ref 0.4–1.2)
Calcium: 9.2 mg/dL (ref 8.4–10.5)
GFR: 92.85 mL/min (ref >60.00)
GLUCOSE: 155 mg/dL — AB (ref 70–99)
Potassium: 4.7 mEq/L (ref 3.5–5.1)
Sodium: 141 mEq/L (ref 135–145)

## 2013-09-05 LAB — HM DIABETES FOOT EXAM: HM Diabetic Foot Exam: NORMAL

## 2013-09-05 LAB — LIPID PANEL
CHOL/HDL RATIO: 3
CHOLESTEROL: 174 mg/dL (ref 0–200)
HDL: 50.1 mg/dL (ref >39.00)
LDL CALC: 98 mg/dL (ref 0–99)
NonHDL: 123.9
TRIGLYCERIDES: 131 mg/dL (ref 0.0–149.0)
VLDL: 26.2 mg/dL (ref 0.0–40.0)

## 2013-09-05 LAB — MICROALBUMIN / CREATININE URINE RATIO
Creatinine,U: 224.1 mg/dL
Microalb Creat Ratio: 0.3 mg/g (ref 0.0–30.0)
Microalb, Ur: 0.6 mg/dL (ref 0.0–1.9)

## 2013-09-05 LAB — HM DIABETES EYE EXAM

## 2013-09-05 LAB — HEMOGLOBIN A1C: HEMOGLOBIN A1C: 7.2 % — AB (ref 4.6–6.5)

## 2013-09-05 NOTE — Progress Notes (Signed)
Pre visit review using our clinic review tool, if applicable. No additional management support is needed unless otherwise documented below in the visit note. 

## 2013-09-05 NOTE — Progress Notes (Signed)
   Subjective:    Patient ID: Sherry Gallagher, female    DOB: [birth year], 58 y.o.   MRN: [id number]  Diabetes Pertinent negatives for hypoglycemia include no dizziness, headaches or seizures. Pertinent negatives for diabetes include no chest pain, no fatigue, no polydipsia, no polyuria and no weakness.   Medical follow up  Type 2 diabetes. Patient does not monitor sugars regularly. Fasting sugars are usually around 140. No A1c in over a year. She needs to establish for repeat eye exam. No symptoms of polyuria or polydipsia  Hypertension. Controlled with medication. She has had substantial weight gain in the past year which she attributes to decreased exercise and poor dietary compliance. No headaches. No dizziness. No chest pains. No peripheral edema issues.  Hyperlipidemia treated with pravastatin. No myalgias. Compliant with therapy. No history of CAD or peripheral vascular disease. Reviewed with no changes:  Past Medical History  Diagnosis Date  . GERD (gastroesophageal reflux disease)   . Allergy   . Asthma     seasonal - uses inhalers daily during fall  . Diabetes mellitus     fasting 130s  . Pneumonia     2 yrs   Past Surgical History  Procedure Laterality Date  . Nasal sinus surgery  1980  . Abdominal hysterectomy  99  . Breast surgery Bilateral     breast implants  . Colonoscopy    . Breast biopsy Left 11/10/2012    Procedure: BREAST BIOPSY WITH NEEDLE LOCALIZATION;  Surgeon: Rolm Bookbinder, MD;  Location: [place];  Service: General;  Laterality: Left;    reports that she has never smoked. She has never used smokeless tobacco. She reports that she does not drink alcohol or use illicit drugs. family history includes Alcohol abuse in her father; Cancer in her maternal grandmother and paternal grandmother. Allergies  Allergen Reactions  . Penicillins Hives and Swelling       Review of Systems  Constitutional: Negative for appetite change and fatigue.  Eyes:  Negative for visual disturbance.  Respiratory: Negative for cough, chest tightness, shortness of breath and wheezing.   Cardiovascular: Negative for chest pain, palpitations and leg swelling.  Endocrine: Negative for polydipsia and polyuria.  Neurological: Negative for dizziness, seizures, syncope, weakness, light-headedness and headaches.       Objective:   Physical Exam  Constitutional: She appears well-developed and well-nourished.  Neck: Neck supple. No thyromegaly present.  Cardiovascular: Normal rate and regular rhythm.  Exam reveals no gallop.   Pulmonary/Chest: Effort normal and breath sounds normal. No respiratory distress. She has no wheezes. She has no rales.  Musculoskeletal: She exhibits no edema.          Assessment & Plan:  #1 type 2 diabetes. History of good control. Recent weight gain. Recheck A1c. Work on weight loss. Set up repeat exam #2 hyperlipidemia. Repeat lipid panel. Continue pravastatin #3 hypertension. Well controlled by todays reading.

## 2013-09-06 ENCOUNTER — Other Ambulatory Visit: Payer: Self-pay | Admitting: Family Medicine

## 2013-09-06 DIAGNOSIS — E119 Type 2 diabetes mellitus without complications: Secondary | ICD-10-CM

## 2013-09-16 ENCOUNTER — Other Ambulatory Visit: Payer: Self-pay | Admitting: Family Medicine

## 2013-10-03 LAB — HM DIABETES EYE EXAM

## 2013-10-05 ENCOUNTER — Encounter: Payer: Self-pay | Admitting: Family Medicine

## 2013-10-17 ENCOUNTER — Other Ambulatory Visit: Payer: Self-pay | Admitting: Family Medicine

## 2013-11-02 ENCOUNTER — Telehealth: Payer: Self-pay | Admitting: Family Medicine

## 2013-11-02 MED ORDER — METFORMIN HCL 500 MG PO TABS: ORAL_TABLET | ORAL | Status: DC

## 2013-11-02 NOTE — Telephone Encounter (Signed)
[facility]-[place], [place] - [place], [address] is requesting re-fill on metFORMIN (GLUCOPHAGE) 500 MG tablet

## 2013-11-02 NOTE — Telephone Encounter (Signed)
Rx sent to pharmacy   

## 2013-11-23 ENCOUNTER — Other Ambulatory Visit: Payer: Self-pay

## 2013-12-03 ENCOUNTER — Telehealth: Payer: Self-pay

## 2013-12-03 NOTE — Telephone Encounter (Signed)
Can you please set up a physical for the patient. Inform the patient that her form that she needs signed asked if she received physical exam with labwork. And she has not this year.

## 2013-12-04 NOTE — Telephone Encounter (Signed)
Pt had cpx labs done 7/29 and wants to know if those will be ok to use, or does dr burchette want her to redo the cpx labs? Also, pt states her fasting blood sugars have been running 150-175, only in the am. In the evenings they are fine. Just wanted dr Elease Hashimoto to know, this been going on several weeks.

## 2013-12-04 NOTE — Telephone Encounter (Signed)
Would not need to repeat all her labs, but would get repeat A1C

## 2013-12-06 ENCOUNTER — Other Ambulatory Visit: Payer: Self-pay | Admitting: Family Medicine

## 2013-12-06 NOTE — Telephone Encounter (Signed)
Pt aware.

## 2013-12-07 ENCOUNTER — Telehealth: Payer: Self-pay | Admitting: Family Medicine

## 2013-12-07 MED ORDER — ALBUTEROL SULFATE HFA 108 (90 BASE) MCG/ACT IN AERS: INHALATION_SPRAY | RESPIRATORY_TRACT | Status: DC

## 2013-12-07 NOTE — Telephone Encounter (Signed)
Pt needs refill on advair

## 2013-12-07 NOTE — Telephone Encounter (Signed)
RX sent to pharmacy  

## 2013-12-11 ENCOUNTER — Telehealth: Payer: Self-pay | Admitting: Family Medicine

## 2013-12-11 MED ORDER — FLUTICASONE-SALMETEROL 250-50 MCG/DOSE IN AEPB: 1.0000 | INHALATION_SPRAY | RESPIRATORY_TRACT | Status: DC | PRN

## 2013-12-11 NOTE — Telephone Encounter (Signed)
Refill sent to pharmacy.   

## 2013-12-11 NOTE — Telephone Encounter (Signed)
Pharm states we keep sending the wrong rx. Pt would like the  Fluticasone-Salmeterol (ADVAIR) 250-50 MCG/DOSE AEPB     (not proventil) [facility]?

## 2013-12-14 ENCOUNTER — Other Ambulatory Visit (INDEPENDENT_AMBULATORY_CARE_PROVIDER_SITE_OTHER): Payer: 59

## 2013-12-14 DIAGNOSIS — E119 Type 2 diabetes mellitus without complications: Secondary | ICD-10-CM

## 2013-12-14 LAB — HEMOGLOBIN A1C: HEMOGLOBIN A1C: 7 % — AB (ref 4.6–6.5)

## 2013-12-21 ENCOUNTER — Ambulatory Visit (INDEPENDENT_AMBULATORY_CARE_PROVIDER_SITE_OTHER): Payer: 59 | Admitting: Family Medicine

## 2013-12-21 ENCOUNTER — Encounter: Payer: Self-pay | Admitting: Family Medicine

## 2013-12-21 VITALS — BP 110/80 | HR 84 | Temp 98.0°F | Ht 65.5 in | Wt 228.0 lb

## 2013-12-21 DIAGNOSIS — Z23 Encounter for immunization: Secondary | ICD-10-CM

## 2013-12-21 DIAGNOSIS — Z0000 Encounter for general adult medical examination without abnormal findings: Secondary | ICD-10-CM

## 2013-12-21 MED ORDER — METFORMIN HCL 500 MG PO TABS: ORAL_TABLET | ORAL | Status: DC

## 2013-12-21 NOTE — Patient Instructions (Signed)
Increase Metformin to one in AM and two at night. Continue with weight loss and exercise efforts.

## 2013-12-21 NOTE — Progress Notes (Signed)
Subjective:    Patient ID: Sherry Gallagher, female    DOB: 04-25-1955, 58 y.o.   MRN: [id number]  HPI Patient is here for complete physical. She still sees gynecologist. Her chronic problems are reviewed and she has history of obesity, type 2 diabetes, seasonal perennial allergies, asthma, hyperlipidemia, hypertension. She has lost 4 pounds since last visit and is doing water aerobics. Overall, she feels very well. She is surprised because recent A1c has improved to 7.0% but she's had fasting blood sugars frequently 150-160.  Immunizations reviewed. She refuses flu vaccine. She's had previous Pneumovax. Needs tetanus booster.  colonoscopy 2014.  Past Medical History  Diagnosis Date  . GERD (gastroesophageal reflux disease)   . Allergy   . Asthma     seasonal - uses inhalers daily during fall  . Diabetes mellitus     fasting 130s  . Pneumonia     2 yrs   Past Surgical History  Procedure Laterality Date  . Nasal sinus surgery  1980  . Abdominal hysterectomy  99  . Breast surgery Bilateral     breast implants  . Colonoscopy    . Breast biopsy Left 11/10/2012    Procedure: BREAST BIOPSY WITH NEEDLE LOCALIZATION;  Surgeon: Rolm Bookbinder, MD;  Location: [place];  Service: General;  Laterality: Left;    reports that she has never smoked. She has never used smokeless tobacco. She reports that she does not drink alcohol or use illicit drugs. family history includes Alcohol abuse in her father; Cancer in her maternal grandmother and paternal grandmother. Allergies  Allergen Reactions  . Penicillins Hives and Swelling      Review of Systems  Constitutional: Negative for fever, activity change, appetite change, fatigue and unexpected weight change.  HENT: Negative for ear pain, hearing loss, sore throat and trouble swallowing.   Eyes: Negative for visual disturbance.  Respiratory: Negative for cough and shortness of breath.   Cardiovascular: Negative for chest pain and  palpitations.  Gastrointestinal: Negative for abdominal pain, diarrhea, constipation and blood in stool.  Endocrine: Negative for polydipsia and polyuria.  Genitourinary: Negative for dysuria and hematuria.  Musculoskeletal: Negative for myalgias, back pain and arthralgias.  Skin: Negative for rash.  Neurological: Negative for dizziness, syncope and headaches.  Hematological: Negative for adenopathy.  Psychiatric/Behavioral: Negative for confusion and dysphoric mood.       Objective:   Physical Exam  Constitutional: She is oriented to person, place, and time. She appears well-developed and well-nourished.  HENT:  Head: Normocephalic and atraumatic.  Eyes: EOM are normal. Pupils are equal, round, and reactive to light.  Neck: Normal range of motion. Neck supple. No thyromegaly present.  Cardiovascular: Normal rate, regular rhythm and normal heart sounds.   No murmur heard. Pulmonary/Chest: Breath sounds normal. No respiratory distress. She has no wheezes. She has no rales.  Abdominal: Soft. Bowel sounds are normal. She exhibits no distension and no mass. There is no tenderness. There is no rebound and no guarding.  Genitourinary:  Per gyn  Musculoskeletal: Normal range of motion. She exhibits no edema.  Lymphadenopathy:    She has no cervical adenopathy.  Neurological: She is alert and oriented to person, place, and time. She displays normal reflexes. No cranial nerve deficit.  Skin: No rash noted.  Psychiatric: She has a normal mood and affect. Her behavior is normal. Judgment and thought content normal.          Assessment & Plan:  Health maintenance exam. Recent A1c of  7.0 reviewed. She does have elevated fasting glucose but overall control improving. We elected to increase metformin to 500 mg one each morning and 2 at night. Continue weight loss efforts. Recheck A1c in 3-4 months. Tetanus booster given. Flu vaccine recommended and declined and she had recent lipids last  summer which were reviewed. Colonoscopy up-to-date. She'll continue GYN follow-up.

## 2013-12-21 NOTE — Progress Notes (Signed)
Pre visit review using our clinic review tool, if applicable. No additional management support is needed unless otherwise documented below in the visit note. Lab Results  Component Value Date   HGBA1C 7.0* 12/14/2013   HGBA1C 7.2* 09/05/2013   HGBA1C 6.5 06/09/2012   Lab Results  Component Value Date   MICROALBUR 0.6 09/05/2013   LDLCALC 98 09/05/2013   CREATININE 0.7 09/05/2013

## 2014-01-02 ENCOUNTER — Telehealth: Payer: Self-pay | Admitting: Family Medicine

## 2014-01-02 MED ORDER — GLUCOSE BLOOD VI STRP: ORAL_STRIP | Status: DC

## 2014-01-02 NOTE — Telephone Encounter (Signed)
[facility]-[place], [place] - [place], [address] is requesting re-fill on ONE TOUCH ULTRA TEST test strip. Pharmacy states the patient is out.

## 2014-01-02 NOTE — Telephone Encounter (Signed)
Rx sent to pharmacy   

## 2014-02-15 ENCOUNTER — Encounter: Payer: Self-pay | Admitting: Internal Medicine

## 2014-05-15 ENCOUNTER — Other Ambulatory Visit: Payer: Self-pay | Admitting: Family Medicine

## 2014-06-11 ENCOUNTER — Other Ambulatory Visit: Payer: Self-pay | Admitting: Family Medicine

## 2014-07-29 ENCOUNTER — Encounter: Payer: Self-pay | Admitting: Family Medicine

## 2014-07-29 ENCOUNTER — Ambulatory Visit (INDEPENDENT_AMBULATORY_CARE_PROVIDER_SITE_OTHER): Payer: Managed Care, Other (non HMO) | Admitting: Family Medicine

## 2014-07-29 VITALS — BP 130/80 | HR 104 | Temp 98.4°F | Wt 229.0 lb

## 2014-07-29 DIAGNOSIS — E119 Type 2 diabetes mellitus without complications: Secondary | ICD-10-CM

## 2014-07-29 DIAGNOSIS — M79672 Pain in left foot: Secondary | ICD-10-CM | POA: Diagnosis not present

## 2014-07-29 NOTE — Progress Notes (Signed)
   Subjective:    Patient ID: Sherry Gallagher, female    DOB: [birth year], 59 y.o.   MRN: [id number]  HPI  patient is a type II diabetic who is seen with almost 1 month history of left foot pain. She states years ago she had similar type swelling and was diagnosed with possible "gout ". She does not have any pain involving the metatarsophalangeal joint. Her pain is mostly along the distal aspect of the second metatarsal but to some extent the third and fourth metatarsals as well. Denies any specific injury. She's had some swelling which comes and goes. No fevers or chills. Pain is worse with ambulation. Some improvement with elevation. No confirmed history of gout. She's taken some anti-inflammatory with  Advil which helps.   No recent change of shoe wear.  Type 2 diabetes which is been fairly well controlled. No recent hypoglycemic symptoms. She remains on metformin and blood sugars have been relatively stable.  Past Medical History  Diagnosis Date  . GERD (gastroesophageal reflux disease)   . Allergy   . Asthma     seasonal - uses inhalers daily during fall  . Diabetes mellitus     fasting 130s  . Pneumonia     2 yrs   Past Surgical History  Procedure Laterality Date  . Nasal sinus surgery  1980  . Abdominal hysterectomy  99  . Breast surgery Bilateral     breast implants  . Colonoscopy    . Breast biopsy Left 11/10/2012    Procedure: BREAST BIOPSY WITH NEEDLE LOCALIZATION;  Surgeon: Rolm Bookbinder, MD;  Location: [facility];  Service: General;  Laterality: Left;    reports that she has never smoked. She has never used smokeless tobacco. She reports that she does not drink alcohol or use illicit drugs. family history includes Alcohol abuse in her father; Cancer in her maternal grandmother and paternal grandmother. Allergies  Allergen Reactions  . Penicillins Hives and Swelling      Review of Systems  Constitutional: Negative for fever and chills.  Respiratory: Negative for  shortness of breath.   Cardiovascular: Negative for chest pain.  Skin: Negative for rash.  Neurological: Negative for numbness.  Hematological: Negative for adenopathy.       Objective:   Physical Exam  Constitutional: She appears well-developed and well-nourished.  Cardiovascular: Normal rate and regular rhythm.   Pulmonary/Chest: Effort normal and breath sounds normal. No respiratory distress. She has no wheezes. She has no rales.  Musculoskeletal:  Left foot reveals some diffuse swelling along the dorsal aspect. Very mild erythema. No warmth. She has tenderness along the distal second metatarsal and to some extent the third and fourth as well. Pain is poorly localized. Good capillary refill. Good distal foot pulses. She does not have any tenderness around the metatarsophalangeal joint  Neurological:  Normal sensory function with monofilament testing left foot          Assessment & Plan:  Left foot pain and edema. Duration of approximate 1 month. No specific injury. No evidence for cellulitis. Doubt Charcot changes of the foot-she has had type 2 diabetes but no evidence for neuropathy.  Rule out stress fracture. Doubt this represents gout.  Check labs with CBC, uric acid and x-Sherry left foot. We discussed possible use of medications and she will stick with over-the-counter anti-inflammatory short-term  Type 2 diabetes. History of good control. Recheck A1c

## 2014-07-29 NOTE — Progress Notes (Signed)
Pre visit review using our clinic review tool, if applicable. No additional management support is needed unless otherwise documented below in the visit note. 

## 2014-07-30 ENCOUNTER — Ambulatory Visit (INDEPENDENT_AMBULATORY_CARE_PROVIDER_SITE_OTHER)
Admission: RE | Admit: 2014-07-30 | Discharge: 2014-07-30 | Disposition: A | Discharge status: Home / Self Care | Payer: Managed Care, Other (non HMO) | Source: Ambulatory Visit | Attending: Family Medicine | Admitting: Family Medicine

## 2014-07-30 DIAGNOSIS — M79672 Pain in left foot: Secondary | ICD-10-CM

## 2014-07-30 LAB — CBC WITH DIFFERENTIAL/PLATELET
BASOS ABS: 0 10*3/uL (ref 0.0–0.1)
BASOS PCT: 0.1 % (ref 0.0–3.0)
Eosinophils Absolute: 0.3 10*3/uL (ref 0.0–0.7)
Eosinophils Relative: 2.9 % (ref 0.0–5.0)
HCT: 40.2 % (ref 36.0–46.0)
HEMOGLOBIN: 13.5 g/dL (ref 12.0–15.0)
LYMPHS PCT: 27 % (ref 12.0–46.0)
Lymphs Abs: 2.8 10*3/uL (ref 0.7–4.0)
MCHC: 33.6 g/dL (ref 30.0–36.0)
MCV: 92.3 fl (ref 78.0–100.0)
Monocytes Absolute: 0.6 10*3/uL (ref 0.1–1.0)
Monocytes Relative: 5.7 % (ref 3.0–12.0)
Neutro Abs: 6.7 10*3/uL (ref 1.4–7.7)
Neutrophils Relative %: 64.3 % (ref 43.0–77.0)
Platelets: 377 10*3/uL (ref 150.0–400.0)
RBC: 4.36 Mil/uL (ref 3.87–5.11)
RDW: 13.7 % (ref 11.5–15.5)
WBC: 10.4 10*3/uL (ref 4.0–10.5)

## 2014-07-30 LAB — HEMOGLOBIN A1C: Hgb A1c MFr Bld: 6.8 % — ABNORMAL HIGH (ref 4.6–6.5)

## 2014-07-30 LAB — URIC ACID: Uric Acid, Serum: 8.1 mg/dL — ABNORMAL HIGH (ref 2.4–7.0)

## 2014-07-31 ENCOUNTER — Telehealth: Payer: Self-pay | Admitting: Family Medicine

## 2014-07-31 ENCOUNTER — Other Ambulatory Visit: Payer: Self-pay | Admitting: *Deleted

## 2014-07-31 MED ORDER — COLCHICINE 0.6 MG PO TABS: ORAL_TABLET | ORAL | Status: DC

## 2014-07-31 NOTE — Telephone Encounter (Signed)
Pt would like a call back about her lab and xray results

## 2014-07-31 NOTE — Telephone Encounter (Signed)
See result note.  

## 2014-08-05 ENCOUNTER — Other Ambulatory Visit: Payer: Self-pay | Admitting: Family Medicine

## 2014-08-14 ENCOUNTER — Ambulatory Visit (INDEPENDENT_AMBULATORY_CARE_PROVIDER_SITE_OTHER): Payer: Managed Care, Other (non HMO) | Admitting: Family Medicine

## 2014-08-14 ENCOUNTER — Encounter: Payer: Self-pay | Admitting: Family Medicine

## 2014-08-14 VITALS — BP 128/72 | HR 85 | Temp 97.3°F | Wt 229.0 lb

## 2014-08-14 DIAGNOSIS — M79672 Pain in left foot: Secondary | ICD-10-CM | POA: Diagnosis not present

## 2014-08-14 MED ORDER — TRETINOIN 0.1 % EX CREA: TOPICAL_CREAM | Freq: Every day | CUTANEOUS | Status: DC

## 2014-08-14 NOTE — Progress Notes (Signed)
   Subjective:    Patient ID: Sherry Gallagher, female    DOB: February 24, 1955, 59 y.o.   MRN: [id number]  HPI Follow-up left foot pain. Refer to prior note. Diagnosis was somewhat in question. Question of gout. Her uric acid did come back 8.1 and we explained this does not confirm gout. There were no signs of cellulitis. X-Sherry did not reveal stress fracture. White blood count normal. No signs of Charcot changes of foot. Diabetes well controlled with A1c 6.8%. We prescribed colchicine and her foot pain did seem to improve immediately though she still has some occasional swelling but no redness or warmth.  Past Medical History  Diagnosis Date  . GERD (gastroesophageal reflux disease)   . Allergy   . Asthma     seasonal - uses inhalers daily during fall  . Diabetes mellitus     fasting 130s  . Pneumonia     2 yrs   Past Surgical History  Procedure Laterality Date  . Nasal sinus surgery  1980  . Abdominal hysterectomy  99  . Breast surgery Bilateral     breast implants  . Colonoscopy    . Breast biopsy Left 11/10/2012    Procedure: BREAST BIOPSY WITH NEEDLE LOCALIZATION;  Surgeon: Rolm Bookbinder, MD;  Location: [facility];  Service: General;  Laterality: Left;    reports that she has never smoked. She has never used smokeless tobacco. She reports that she does not drink alcohol or use illicit drugs. family history includes Alcohol abuse in her father; Cancer in her maternal grandmother and paternal grandmother. Allergies  Allergen Reactions  . Penicillins Hives and Swelling       Review of Systems  Constitutional: Negative for fever and chills.       Objective:   Physical Exam  Constitutional: She appears well-developed and well-nourished.  Cardiovascular: Normal rate and regular rhythm.   Musculoskeletal:  Left foot very minimal edema dorsally. She has some mild tenderness over the distal left second metatarsal. No erythema or warmth.          Assessment & Plan:  Left  foot pain. Differential is tendinitis, resolving gout, stress fracture not seen on recent x-Sherry. We offered repeat x-Sherry which she declines-as this is improving. We have suggested icing and elevation at night.

## 2014-08-14 NOTE — Progress Notes (Signed)
Pre visit review using our clinic review tool, if applicable. No additional management support is needed unless otherwise documented below in the visit note. 

## 2014-08-24 ENCOUNTER — Other Ambulatory Visit: Payer: Self-pay | Admitting: Family Medicine

## 2014-10-31 LAB — HM DIABETES EYE EXAM

## 2014-11-05 ENCOUNTER — Encounter: Payer: Self-pay | Admitting: Family Medicine

## 2014-11-07 ENCOUNTER — Ambulatory Visit (INDEPENDENT_AMBULATORY_CARE_PROVIDER_SITE_OTHER): Payer: Managed Care, Other (non HMO) | Admitting: Family Medicine

## 2014-11-07 ENCOUNTER — Encounter: Payer: Self-pay | Admitting: Family Medicine

## 2014-11-07 ENCOUNTER — Other Ambulatory Visit: Payer: Self-pay | Admitting: Family Medicine

## 2014-11-07 VITALS — BP 120/74 | HR 99 | Temp 98.1°F | Ht 65.5 in | Wt 230.6 lb

## 2014-11-07 DIAGNOSIS — E785 Hyperlipidemia, unspecified: Secondary | ICD-10-CM | POA: Diagnosis not present

## 2014-11-07 DIAGNOSIS — I10 Essential (primary) hypertension: Secondary | ICD-10-CM

## 2014-11-07 DIAGNOSIS — E119 Type 2 diabetes mellitus without complications: Secondary | ICD-10-CM

## 2014-11-07 LAB — BASIC METABOLIC PANEL
BUN: 18 mg/dL (ref 6–23)
CALCIUM: 9.8 mg/dL (ref 8.4–10.5)
CHLORIDE: 102 meq/L (ref 96–112)
CO2: 27 meq/L (ref 19–32)
Creatinine, Ser: 0.63 mg/dL (ref 0.40–1.20)
GFR: 102.71 mL/min (ref >60.00)
Glucose, Bld: 131 mg/dL — ABNORMAL HIGH (ref 70–99)
Potassium: 4.4 mEq/L (ref 3.5–5.1)
SODIUM: 140 meq/L (ref 135–145)

## 2014-11-07 LAB — LIPID PANEL
CHOLESTEROL: 176 mg/dL (ref 0–200)
HDL: 53.5 mg/dL (ref >39.00)
LDL Cholesterol: 96 mg/dL (ref 0–99)
NONHDL: 122.17
Total CHOL/HDL Ratio: 3
Triglycerides: 130 mg/dL (ref 0.0–149.0)
VLDL: 26 mg/dL (ref 0.0–40.0)

## 2014-11-07 LAB — HEPATIC FUNCTION PANEL
ALT: 18 U/L (ref 0–35)
AST: 16 U/L (ref 0–37)
Albumin: 4.1 g/dL (ref 3.5–5.2)
Alkaline Phosphatase: 82 U/L (ref 39–117)
BILIRUBIN TOTAL: 0.6 mg/dL (ref 0.2–1.2)
Bilirubin, Direct: 0.2 mg/dL (ref 0.0–0.3)
Total Protein: 7.2 g/dL (ref 6.0–8.3)

## 2014-11-07 LAB — HEMOGLOBIN A1C: HEMOGLOBIN A1C: 7 % — AB (ref 4.6–6.5)

## 2014-11-07 MED ORDER — METFORMIN HCL 1000 MG PO TABS: ORAL_TABLET | ORAL | Status: DC

## 2014-11-07 MED ORDER — ALBUTEROL SULFATE HFA 108 (90 BASE) MCG/ACT IN AERS: INHALATION_SPRAY | RESPIRATORY_TRACT | Status: DC

## 2014-11-07 NOTE — Addendum Note (Signed)
Addended by: Colleen Can on: 11/07/2014 10:52 AM   Modules accepted: Orders

## 2014-11-07 NOTE — Progress Notes (Signed)
   Subjective:    Patient ID: Sherry Gallagher, female    DOB: 09-30-1955, 59 y.o.   MRN: [id number]  HPI Patient seen for medical follow-up Type 2 diabetes. Last A1c 6.8%. Over the past couple months she seen an increase in fasting blood sugars around 150 to 170. No polyuria or polydipsia. Weight stable. Inconsistent with exercise. She's had fairly good dietary compliance. No visual changes  Hypertension which is been stable. She remains on benazepril HCTZ. No dizziness.  Hyperlipidemia on pravastatin. She's not had lipids in over one year. No myalgias. No recent chest pains. Compliant with all medications.  Past Medical History  Diagnosis Date  . GERD (gastroesophageal reflux disease)   . Allergy   . Asthma     seasonal - uses inhalers daily during fall  . Diabetes mellitus     fasting 130s  . Pneumonia     2 yrs   Past Surgical History  Procedure Laterality Date  . Nasal sinus surgery  1980  . Abdominal hysterectomy  99  . Breast surgery Bilateral     breast implants  . Colonoscopy    . Breast biopsy Left 11/10/2012    Procedure: BREAST BIOPSY WITH NEEDLE LOCALIZATION;  Surgeon: Rolm Bookbinder, MD;  Location: [facility];  Service: General;  Laterality: Left;    reports that she has never smoked. She has never used smokeless tobacco. She reports that she does not drink alcohol or use illicit drugs. family history includes Alcohol abuse in her father; Cancer in her maternal grandmother and paternal grandmother. Allergies  Allergen Reactions  . Penicillins Hives and Swelling      Review of Systems  Constitutional: Positive for fatigue.  Eyes: Negative for visual disturbance.  Respiratory: Negative for cough, chest tightness, shortness of breath and wheezing.   Cardiovascular: Negative for chest pain, palpitations and leg swelling.  Endocrine: Negative for polydipsia and polyuria.  Genitourinary: Negative for dysuria.  Neurological: Negative for dizziness, seizures,  syncope, weakness, light-headedness and headaches.       Objective:   Physical Exam  Constitutional: She appears well-developed and well-nourished.  Neck: Neck supple. No thyromegaly present.  Cardiovascular: Normal rate and regular rhythm.  Exam reveals no gallop.   No murmur heard. Pulmonary/Chest: Effort normal and breath sounds normal. No respiratory distress. She has no wheezes. She has no rales.  Musculoskeletal: She exhibits no edema.          Assessment & Plan:  #1 type 2 diabetes. History of good control but recent worsening. Step up exercise and lose some weight. Recheck A1c. Increase metformin to 1000 mg twice daily. We discussed possible addition of S GLT 2 class or GLP-1 class if blood sugars start to climb further #2 hypertension stable and well controlled. Check basic metabolic panel #3 dyslipidemia. Recheck lipid and hepatic panel. Continue pravastatin.

## 2014-11-07 NOTE — Patient Instructions (Signed)
Go ahead and increase metformin to 500 mg two twice daily

## 2014-11-07 NOTE — Progress Notes (Signed)
Pre visit review using our clinic review tool, if applicable. No additional management support is needed unless otherwise documented below in the visit note. 

## 2014-11-07 NOTE — Addendum Note (Signed)
Addended by: Colleen Can on: 11/07/2014 11:23 AM   Modules accepted: Orders, Medications

## 2015-04-07 ENCOUNTER — Other Ambulatory Visit: Payer: Self-pay | Admitting: Family Medicine

## 2015-04-08 ENCOUNTER — Ambulatory Visit (INDEPENDENT_AMBULATORY_CARE_PROVIDER_SITE_OTHER): Payer: Managed Care, Other (non HMO) | Admitting: Family Medicine

## 2015-04-08 ENCOUNTER — Encounter: Payer: Self-pay | Admitting: Family Medicine

## 2015-04-08 VITALS — BP 128/68 | HR 90 | Temp 97.6°F | Ht 65.5 in | Wt 233.4 lb

## 2015-04-08 DIAGNOSIS — M545 Low back pain, unspecified: Secondary | ICD-10-CM

## 2015-04-08 MED ORDER — NAPROXEN 500 MG PO TABS: 500.0000 mg | ORAL_TABLET | Freq: Two times a day (BID) | ORAL | Status: DC

## 2015-04-08 MED ORDER — CYCLOBENZAPRINE HCL 5 MG PO TABS
5.0000 mg | ORAL_TABLET | Freq: Three times a day (TID) | ORAL | Status: DC | PRN
Start: 2015-04-08 — End: 2015-11-12

## 2015-04-08 NOTE — Progress Notes (Signed)
Pre visit review using our clinic review tool, if applicable. No additional management support is needed unless otherwise documented below in the visit note. 

## 2015-04-08 NOTE — Patient Instructions (Signed)
Before you leave: -Low back exercises -Schedule follow up in 3-4 weeks  Please use heat and topical sports creams for the pain. May use the muscle relaxer and the naproxen as needed or pain and spasm as well per instructions. Do not use other over-the-counter pain medications along with the naproxen.  Please do the home exercises at least 3-4 days per week and continue gentle activity as tolerated. Next  Please follow up sooner if worsening or new symptoms develop.

## 2015-04-08 NOTE — Progress Notes (Signed)
HPI:  Sherry Gallagher is a very pleasant 60 year old here for an acute visit for low back pain. She reports that the pain started 1 week ago, and she cannot think of any inciting event. She is quite active with housework and with water aerobics. She reports the pain is in the right low back, is a 5 out of 6 at the worst and in certain positions almost resolves. She does not like to take medications, but has tried several topical creams. She denies radiation of pain, weakness, numbness, bowel or bladder dysfunction, fevers, malaise, urinary symptoms or abdominal pain or recent illness. She reports a history of occasional back pain in the past that resolved more promptly on its own.  ROS: See pertinent positives and negatives per HPI.  Past Medical History  Diagnosis Date  . GERD (gastroesophageal reflux disease)   . Allergy   . Asthma     seasonal - uses inhalers daily during fall  . Diabetes mellitus     fasting 130s  . Pneumonia     2 yrs    Past Surgical History  Procedure Laterality Date  . Nasal sinus surgery  1980  . Abdominal hysterectomy  99  . Breast surgery Bilateral     breast implants  . Colonoscopy    . Breast biopsy Left 11/10/2012    Procedure: BREAST BIOPSY WITH NEEDLE LOCALIZATION;  Surgeon: Rolm Bookbinder, MD;  Location: [facility];  Service: General;  Laterality: Left;    Family History  Problem Relation Age of Onset  . Alcohol abuse Father   . Cancer Maternal Grandmother     breast  . Cancer Paternal Grandmother     breast    Social History   Social History  . Marital Status: Married    Spouse Name: N/A  . Number of Children: N/A  . Years of Education: N/A   Social History Main Topics  . Smoking status: Never Smoker   . Smokeless tobacco: Never Used  . Alcohol Use: No  . Drug Use: No  . Sexual Activity: Yes    Birth Control/ Protection: None   Other Topics Concern  . None   Social History Narrative     Current outpatient prescriptions:  .   ADVAIR DISKUS 250-50 MCG/DOSE AEPB, INHALE ONE PUFF into THE lungs AS NEEDED, Disp: 60 each, Rfl: 2 .  albuterol (PROVENTIL HFA) 108 (90 BASE) MCG/ACT inhaler, Inhale 2 puffs into the lungs every 4 hours as needed for wheezing or shortness of breath., Disp: 6.7 g, Rfl: 5 .  albuterol (PROVENTIL) (2.5 MG/3ML) 0.083% nebulizer solution, Take 3 mLs (2.5 mg total) by nebulization every 4 (four) hours as needed for wheezing or shortness of breath., Disp: 75 mL, Rfl: 2 .  aspirin EC 81 MG tablet, Take 81 mg by mouth every morning., Disp: , Rfl:  .  benazepril-hydrochlorthiazide (LOTENSIN HCT) 20-25 MG per tablet, TAKE 1/2 TABLET BY MOUTH EVERY DAY, Disp: 155 tablet, Rfl: 0 .  cetirizine (ZYRTEC) 10 MG tablet, Take 10 mg by mouth every other day. Every other day, Disp: , Rfl:  .  colchicine 0.6 MG tablet, Take 2 tablets today then 1 twice daily, Disp: 60 tablet, Rfl: 0 .  COMFORT LANCETS MISC, Use as directed.  Ultra Soft Touch Lancets, Disp: 100 each, Rfl: 11 .  esomeprazole (NEXIUM) 40 MG capsule, Take 40 mg by mouth every other day., Disp: , Rfl:  .  fluticasone (FLONASE) 50 MCG/ACT nasal spray, Place 2 sprays into the nose daily.,  Disp: , Rfl:  .  glucose blood (ONE TOUCH TEST STRIPS) test strip, Ultra Test Strips: Check one time daily. E11.9, Disp: 100 each, Rfl: 3 .  metFORMIN (GLUCOPHAGE) 1000 MG tablet, TAKE 1 TABLET BY MOUTH EVERY MORNING AND TAKE 1 TABLET EVERY EVENING, Disp: 180 tablet, Rfl: 0 .  Multiple Vitamin (MULTIVITAMIN) tablet, Take 1 tablet by mouth daily., Disp: , Rfl:  .  ondansetron (ZOFRAN) 8 MG tablet, Take 1 tablet (8 mg total) by mouth every 8 (eight) hours as needed for nausea., Disp: 6 tablet, Rfl: 1 .  pravastatin (PRAVACHOL) 40 MG tablet, TAKE 1 TABLET BY MOUTH EVERY DAY, Disp: 90 tablet, Rfl: 0 .  tretinoin (RETIN-A) 0.1 % cream, Apply topically at bedtime., Disp: 45 g, Rfl: 3 .  VITAMIN D, CHOLECALCIFEROL, PO, Take 1 tablet by mouth every evening., Disp: , Rfl:  .   cyclobenzaprine (FLEXERIL) 5 MG tablet, Take 1 tablet (5 mg total) by mouth 3 (three) times daily as needed for muscle spasms., Disp: 20 tablet, Rfl: 0 .  naproxen (NAPROSYN) 500 MG tablet, Take 1 tablet (500 mg total) by mouth 2 (two) times daily with a meal., Disp: 30 tablet, Rfl: 0  EXAM:  Filed Vitals:   04/08/15 0937  BP: 128/68  Pulse: 90  Temp: 97.6 F (36.4 C)    Body mass index is 38.24 kg/(m^2).  GENERAL: vitals reviewed and listed above, alert, oriented, appears well hydrated and in no acute distress  HEENT: atraumatic, conjunttiva clear, no obvious abnormalities on inspection of external nose and ears  NECK: no obvious masses on inspection  LUNGS: clear to auscultation bilaterally, no wheezes, rales or rhonchi, good air movement  CV: HRRR, no peripheral edema  MS: moves all extremities without noticeable abnormality Normal Gait Normal inspection of back, no obvious scoliosis or leg length descrepancy No bony TTP Soft tissue TTP at: R lower lumbar paraspinal musculature -/+ tests: neg trendelenburg,mildly + R facet loading, -SLRT, -CLRT, -FABER, -FADIR Normal muscle strength, sensation to light touch and DTRs in LEs bilaterally  PSYCH: pleasant and cooperative, no obvious depression or anxiety  ASSESSMENT AND PLAN:  Discussed the following assessment and plan:  Right-sided low back pain without sciatica  -we discussed possible serious and likely etiologies, workup and treatment, treatment risks and return precautions - suspect a muscular or facet pathology given her history and exam findings. Given no neurological deficits or alarm features, opted to treat conservatively with when necessary analgesics, when necessary muscle relaxer, a home exercise program and close follow-up in 3-4 weeks. -of course, we advised [place]  to return or notify a doctor immediately if symptoms worsen or persist or new concerns arise in the interim.   Patient Instructions  Before  you leave: -Low back exercises -Schedule follow up in 3-4 weeks  Please use heat and topical sports creams for the pain. May use the muscle relaxer and the naproxen as needed or pain and spasm as well per instructions. Do not use other over-the-counter pain medications along with the naproxen.  Please do the home exercises at least 3-4 days per week and continue gentle activity as tolerated. Next  Please follow up sooner if worsening or new symptoms develop.     Colin Benton R.

## 2015-04-22 ENCOUNTER — Ambulatory Visit (INDEPENDENT_AMBULATORY_CARE_PROVIDER_SITE_OTHER): Payer: Managed Care, Other (non HMO) | Admitting: Family Medicine

## 2015-04-22 VITALS — BP 110/80 | HR 108 | Temp 98.2°F | Ht 65.5 in | Wt 234.0 lb

## 2015-04-22 DIAGNOSIS — I10 Essential (primary) hypertension: Secondary | ICD-10-CM

## 2015-04-22 DIAGNOSIS — R350 Frequency of micturition: Secondary | ICD-10-CM

## 2015-04-22 DIAGNOSIS — E119 Type 2 diabetes mellitus without complications: Secondary | ICD-10-CM

## 2015-04-22 LAB — POCT URINALYSIS DIPSTICK
Bilirubin, UA: NEGATIVE
Glucose, UA: NEGATIVE
Ketones, UA: NEGATIVE
NITRITE UA: NEGATIVE
PH UA: 6
PROTEIN UA: NEGATIVE
SPEC GRAV UA: 1.02
UROBILINOGEN UA: 0.2

## 2015-04-22 LAB — POCT GLYCOSYLATED HEMOGLOBIN (HGB A1C): HEMOGLOBIN A1C: 7

## 2015-04-22 MED ORDER — NITROFURANTOIN MONOHYD MACRO 100 MG PO CAPS: 100.0000 mg | ORAL_CAPSULE | Freq: Two times a day (BID) | ORAL | Status: DC

## 2015-04-22 NOTE — Progress Notes (Signed)
   Subjective:    Patient ID: Sherry Gallagher, female    DOB: 1955/08/21, 60 y.o.   MRN: [id number]  HPI Patient is seen for acute new problem as well as chronic medical problem follow-up.   One-week history of urine frequency and progressive burning with urination -especially over the past 2 days.  Denies any fever or chills.   No flank pain. She did have some right lower lumbar back pains couple weeks when she was seen but that pain resolved with  Naprosyn. No suprapubic pain. No nausea or vomiting.   Type 2 diabetes. History of recent fair control. Most recent A1c 7.0%.  fasting blood sugars have been ranging around 150. Does not check postprandials. Currently treated with metformin alone 1000 mg twice daily  Hypertension which has been fairly well controlled with Lotensin HCTZ. No recent dizziness. No chest pains.  Past Medical History  Diagnosis Date  . GERD (gastroesophageal reflux disease)   . Allergy   . Asthma     seasonal - uses inhalers daily during fall  . Diabetes mellitus     fasting 130s  . Pneumonia     2 yrs   Past Surgical History  Procedure Laterality Date  . Nasal sinus surgery  1980  . Abdominal hysterectomy  99  . Breast surgery Bilateral     breast implants  . Colonoscopy    . Breast biopsy Left 11/10/2012    Procedure: BREAST BIOPSY WITH NEEDLE LOCALIZATION;  Surgeon: Rolm Bookbinder, MD;  Location: [facility];  Service: General;  Laterality: Left;    reports that she has never smoked. She has never used smokeless tobacco. She reports that she does not drink alcohol or use illicit drugs. family history includes Alcohol abuse in her father; Cancer in her maternal grandmother and paternal grandmother. Allergies  Allergen Reactions  . Penicillins Hives and Swelling      Review of Systems  Constitutional: Negative for fever, chills, appetite change and fatigue.  Eyes: Negative for visual disturbance.  Respiratory: Negative for cough, chest  tightness, shortness of breath and wheezing.   Cardiovascular: Negative for chest pain, palpitations and leg swelling.  Gastrointestinal: Negative for nausea, vomiting, abdominal pain, diarrhea and constipation.  Genitourinary: Positive for dysuria and frequency.  Musculoskeletal: Negative for back pain.  Neurological: Negative for dizziness, seizures, syncope, weakness, light-headedness and headaches.       Objective:   Physical Exam  Constitutional: She appears well-developed and well-nourished.  HENT:  Head: Normocephalic and atraumatic.  Neck: Neck supple. No thyromegaly present.  Cardiovascular: Normal rate, regular rhythm and normal heart sounds.   Pulmonary/Chest: Breath sounds normal.  Abdominal: Soft. Bowel sounds are normal. There is no tenderness.          Assessment & Plan:   #1 dysuria. Suspect uncomplicated cystitis. Urine culture sent. Macrobid 1 twice a day for 5 days. Stay well-hydrated.   #2 type 2 diabetes. History of fair control. Uncomplicated thus far. Recheck A1c. She is encouraged to lose some weight. Would consider possibility of GLP-1 class if A1c increasing   #3 hypertension stable and at goal

## 2015-04-25 LAB — URINE CULTURE

## 2015-04-27 ENCOUNTER — Encounter: Payer: Self-pay | Admitting: Family Medicine

## 2015-06-05 IMAGING — MG MM DIAGNOSTIC UNILATERAL L
2 series · 2 of 2 positions shown · non-contrast
Comparison: 09/18/2012, 09/15/2011, and 07/14/2004 film screen
mammogram.

CLINICAL DATA: Recall from screening mammography

DIGITAL DIAGNOSTIC LEFT BREAST MAMMOGRAM

[L CC]
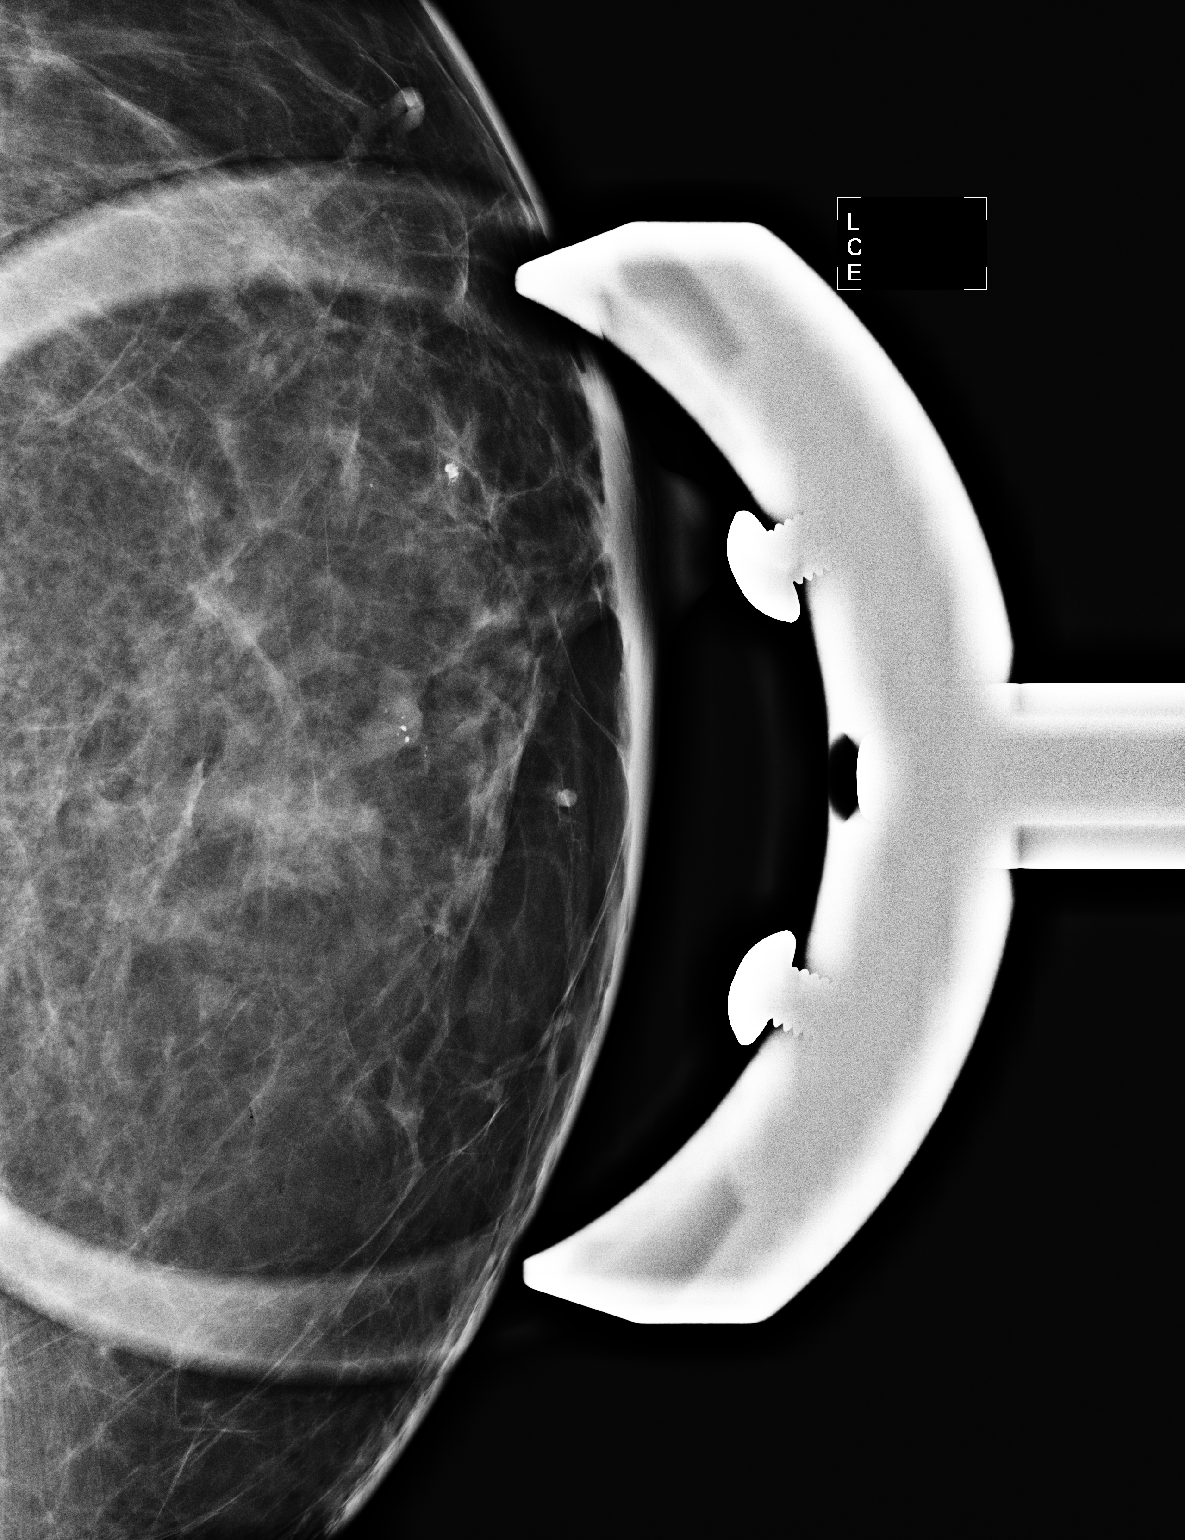

[L ML]
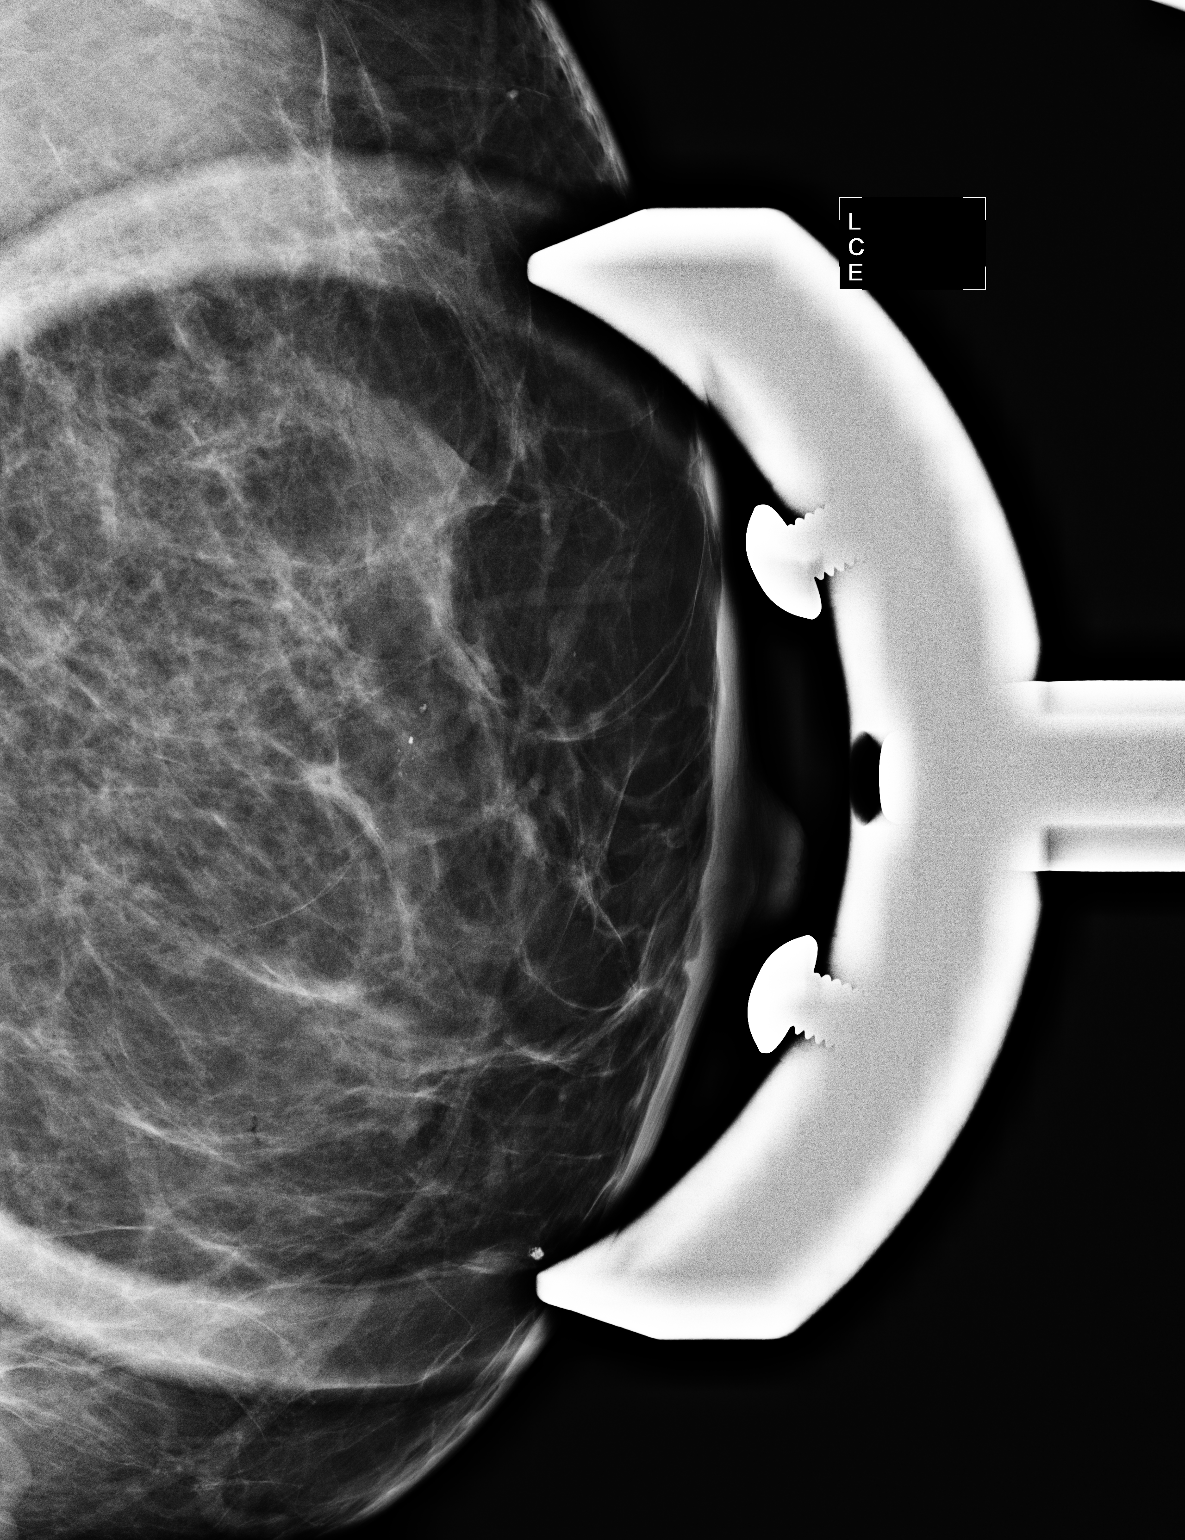

[2 of 2 positions shown; findings below may reference images not displayed]

FINDINGS: ACR Breast Density Category b:  There are scattered areas of
fibroglandular density.

Magnification views of the left breast demonstrate a small group of
pleomorphic calcifications to be present within the superior
subareolar portion of the left breast.  These span 9 mm.  These are
slightly suspicious for DCIS (versus dystrophic calcifications).
Tissue sampling is recommended.  I have discussed stereotactic core
biopsy with the patient.  This will be scheduled per patient
preference.
IMPRESSION: Small group of slightly suspicious calcifications located within
the subareolar portion of the left breast.  Tissue sampling is
recommended and stereotactic core biopsy will be scheduled.

RECOMMENDATION:
Left breast stereotactic core biopsy.

I have discussed the findings and recommendations with the patient.
Results were also provided in writing at the conclusion of the
visit.  If applicable, a reminder letter will be sent to the
patient regarding her next appointment.

BI-RADS CATEGORY 4:  Suspicious abnormality - biopsy should be
considered.

## 2015-06-09 ENCOUNTER — Other Ambulatory Visit: Payer: Self-pay | Admitting: *Deleted

## 2015-06-09 MED ORDER — FLUTICASONE-SALMETEROL 250-50 MCG/DOSE IN AEPB: INHALATION_SPRAY | RESPIRATORY_TRACT | Status: DC

## 2015-06-18 ENCOUNTER — Other Ambulatory Visit: Payer: Self-pay | Admitting: Family Medicine

## 2015-08-08 ENCOUNTER — Other Ambulatory Visit: Payer: Self-pay | Admitting: Family Medicine

## 2015-09-29 ENCOUNTER — Other Ambulatory Visit: Payer: Self-pay | Admitting: Family Medicine

## 2015-11-10 LAB — HM DIABETES EYE EXAM

## 2015-11-11 ENCOUNTER — Other Ambulatory Visit: Payer: Self-pay | Admitting: Family Medicine

## 2015-11-12 ENCOUNTER — Telehealth: Payer: Self-pay | Admitting: Family Medicine

## 2015-11-12 ENCOUNTER — Ambulatory Visit (INDEPENDENT_AMBULATORY_CARE_PROVIDER_SITE_OTHER): Payer: Managed Care, Other (non HMO) | Admitting: Family Medicine

## 2015-11-12 VITALS — BP 122/80 | HR 104 | Temp 97.5°F | Ht 65.5 in | Wt 233.0 lb

## 2015-11-12 DIAGNOSIS — I10 Essential (primary) hypertension: Secondary | ICD-10-CM

## 2015-11-12 DIAGNOSIS — J4540 Moderate persistent asthma, uncomplicated: Secondary | ICD-10-CM

## 2015-11-12 DIAGNOSIS — R400 Somnolence: Secondary | ICD-10-CM

## 2015-11-12 DIAGNOSIS — Z23 Encounter for immunization: Secondary | ICD-10-CM | POA: Diagnosis not present

## 2015-11-12 DIAGNOSIS — E785 Hyperlipidemia, unspecified: Secondary | ICD-10-CM

## 2015-11-12 DIAGNOSIS — E119 Type 2 diabetes mellitus without complications: Secondary | ICD-10-CM

## 2015-11-12 LAB — BASIC METABOLIC PANEL
BUN: 19 mg/dL (ref 6–23)
CO2: 27 mEq/L (ref 19–32)
Calcium: 9.5 mg/dL (ref 8.4–10.5)
Chloride: 101 mEq/L (ref 96–112)
Creatinine, Ser: 0.78 mg/dL (ref 0.40–1.20)
GFR: 80 mL/min (ref >60.00)
Glucose, Bld: 190 mg/dL — ABNORMAL HIGH (ref 70–99)
POTASSIUM: 4.3 meq/L (ref 3.5–5.1)
SODIUM: 138 meq/L (ref 135–145)

## 2015-11-12 LAB — LIPID PANEL
CHOLESTEROL: 186 mg/dL (ref 0–200)
HDL: 54.2 mg/dL (ref >39.00)
LDL Cholesterol: 97 mg/dL (ref 0–99)
NonHDL: 131.35
Total CHOL/HDL Ratio: 3
Triglycerides: 174 mg/dL — ABNORMAL HIGH (ref 0.0–149.0)
VLDL: 34.8 mg/dL (ref 0.0–40.0)

## 2015-11-12 LAB — HEPATIC FUNCTION PANEL
ALT: 22 U/L (ref 0–35)
AST: 18 U/L (ref 0–37)
Albumin: 4.1 g/dL (ref 3.5–5.2)
Alkaline Phosphatase: 79 U/L (ref 39–117)
BILIRUBIN TOTAL: 0.6 mg/dL (ref 0.2–1.2)
Bilirubin, Direct: 0 mg/dL (ref 0.0–0.3)
Total Protein: 7.6 g/dL (ref 6.0–8.3)

## 2015-11-12 LAB — HM DIABETES FOOT EXAM: HM Diabetic Foot Exam: NORMAL

## 2015-11-12 LAB — HEMOGLOBIN A1C: HEMOGLOBIN A1C: 8.2 % — AB (ref 4.6–6.5)

## 2015-11-12 MED ORDER — DULAGLUTIDE 0.75 MG/0.5ML ~~LOC~~ SOAJ: 0.5000 mL | SUBCUTANEOUS | 5 refills | Status: DC

## 2015-11-12 NOTE — Progress Notes (Signed)
Pre visit review using our clinic review tool, if applicable. No additional management support is needed unless otherwise documented below in the visit note. 

## 2015-11-12 NOTE — Addendum Note (Signed)
Addended by: Elio Forget on: 11/12/2015 08:42 AM   Modules accepted: Orders

## 2015-11-12 NOTE — Telephone Encounter (Signed)
Medication sent in for patient. 

## 2015-11-12 NOTE — Progress Notes (Signed)
Subjective:     Patient ID: Sherry Gallagher, female   DOB: 02-12-1955, 60 y.o.   MRN: [id number]  HPI Rashes seen for medical follow-up. Her chronic problems include history of obesity, hypertension, dyslipidemia, moderate persistent asthma, type 2 diabetes. Weight is essentially unchanged from last visit. In consistent exercise. Last A1c 7.0%. Fasting blood sugars have remained elevated recently around 150 occasionally 170. She had eye exam 2 days ago which was normal.  Medications reviewed. Compliant with all. Remains on metformin for diabetes. No polyuria or polydipsia. No chest pains. No dizziness.  Patient complains of frequent daytime somnolence. She states that she snores and she is concerned about possible obstructive sleep apnea. Increase frequency of dosing 1 sitting and reading and watching television.  Past Medical History:  Diagnosis Date  . Allergy   . Asthma    seasonal - uses inhalers daily during fall  . Diabetes mellitus    fasting 130s  . GERD (gastroesophageal reflux disease)   . Pneumonia    2 yrs   Past Surgical History:  Procedure Laterality Date  . ABDOMINAL HYSTERECTOMY  99  . BREAST BIOPSY Left 11/10/2012   Procedure: BREAST BIOPSY WITH NEEDLE LOCALIZATION;  Surgeon: Rolm Bookbinder, MD;  Location: [facility];  Service: General;  Laterality: Left;  . BREAST SURGERY Bilateral    breast implants  . COLONOSCOPY    . NASAL SINUS SURGERY  1980    reports that she has never smoked. She has never used smokeless tobacco. She reports that she does not drink alcohol or use drugs. family history includes Alcohol abuse in her father; Cancer in her maternal grandmother and paternal grandmother. Allergies  Allergen Reactions  . Penicillins Hives and Swelling     Review of Systems  Constitutional: Negative for fatigue and unexpected weight change.  Eyes: Negative for visual disturbance.  Respiratory: Negative for cough, chest tightness, shortness of breath and  wheezing.   Cardiovascular: Negative for chest pain, palpitations and leg swelling.  Endocrine: Negative for polydipsia and polyuria.  Genitourinary: Negative for dysuria.  Neurological: Negative for dizziness, seizures, syncope, weakness, light-headedness and headaches.       Objective:   Physical Exam  Constitutional: She appears well-developed and well-nourished.  Eyes: Pupils are equal, round, and reactive to light.  Neck: Neck supple. No JVD present. No thyromegaly present.  Cardiovascular: Normal rate, regular rhythm and intact distal pulses.  Exam reveals no gallop.   Pulmonary/Chest: Effort normal and breath sounds normal. No respiratory distress. She has no wheezes. She has no rales.  Musculoskeletal: She exhibits no edema.  Neurological: She is alert.  Skin:  Feet reveal no skin lesions. Good distal foot pulses. Good capillary refill. No calluses. Normal sensation with monofilament testing        Assessment:     #1 type 2 diabetes. History of fair control but recent increase fasting blood sugars  #2 hypertension stable and at goal  #3 hyperlipidemia  #4 increased daytime somnolence. Rule out obstructive sleep apnea    Plan:     -Flu vaccine given -Check on coverage for shingles vaccine -Epworth sleep scale score of 9. Set up sleep study to rule out obstructive sleep apnea -Continue weight loss efforts -Check labs with hemoglobin [id number], basic metabolic panel, lipid panel, hepatic panel -We'll plan routine follow-up in 6 months and possibly sooner based on lab results  [name] MD [facility]

## 2015-11-12 NOTE — Patient Instructions (Signed)
Check on coverage for shingles vaccine 

## 2015-11-12 NOTE — Telephone Encounter (Signed)
Pt need new Rx for Trulicity   Pharm:  [facility]  [phone]  Pt is waiting at pharmacy due to the pharmacy is holding it for her.

## 2015-12-05 ENCOUNTER — Other Ambulatory Visit: Payer: Self-pay | Admitting: Family Medicine

## 2015-12-30 ENCOUNTER — Other Ambulatory Visit: Payer: Self-pay | Admitting: Family Medicine

## 2015-12-31 ENCOUNTER — Encounter (HOSPITAL_BASED_OUTPATIENT_CLINIC_OR_DEPARTMENT_OTHER): Payer: Managed Care, Other (non HMO)

## 2016-01-27 ENCOUNTER — Encounter: Payer: Self-pay | Admitting: Family Medicine

## 2016-01-27 ENCOUNTER — Ambulatory Visit (INDEPENDENT_AMBULATORY_CARE_PROVIDER_SITE_OTHER): Payer: Managed Care, Other (non HMO) | Admitting: Family Medicine

## 2016-01-27 VITALS — BP 122/80 | HR 104 | Temp 98.0°F | Ht 65.5 in | Wt 221.0 lb

## 2016-01-27 DIAGNOSIS — E119 Type 2 diabetes mellitus without complications: Secondary | ICD-10-CM | POA: Diagnosis not present

## 2016-01-27 LAB — POCT GLYCOSYLATED HEMOGLOBIN (HGB A1C): Hemoglobin A1C: 6.3

## 2016-01-27 MED ORDER — COMFORT LANCETS MISC: 11 refills | Status: AC

## 2016-01-27 NOTE — Progress Notes (Signed)
Subjective:     Patient ID: Sherry Gallagher, female   DOB: 1955/05/11, 60 y.o.   MRN: [id number]  HPI Patient seen for follow-up regarding type 2 diabetes. We added trulicity back in October after A1c of 8.2%. She has tolerated with no side effects. She's had tremendous response and has lost 12 pounds and is very excited about that. She also remains on metformin. Her fasting blood sugars had been up around 190 and are currently down around 150. She hopes to lose considerable additional weight. She has tried to make some dietary changes as well.  Past Medical History:  Diagnosis Date  . Allergy   . Asthma    seasonal - uses inhalers daily during fall  . Diabetes mellitus    fasting 130s  . GERD (gastroesophageal reflux disease)   . Pneumonia    2 yrs   Past Surgical History:  Procedure Laterality Date  . ABDOMINAL HYSTERECTOMY  99  . BREAST BIOPSY Left 11/10/2012   Procedure: BREAST BIOPSY WITH NEEDLE LOCALIZATION;  Surgeon: Rolm Bookbinder, MD;  Location: [place];  Service: General;  Laterality: Left;  . BREAST SURGERY Bilateral    breast implants  . COLONOSCOPY    . NASAL SINUS SURGERY  1980    reports that she has never smoked. She has never used smokeless tobacco. She reports that she does not drink alcohol or use drugs. family history includes Alcohol abuse in her father; Cancer in her maternal grandmother and paternal grandmother. Allergies  Allergen Reactions  . Penicillins Hives and Swelling     Review of Systems  Constitutional: Negative for fatigue and unexpected weight change.  Eyes: Negative for visual disturbance.  Respiratory: Negative for cough, chest tightness, shortness of breath and wheezing.   Cardiovascular: Negative for chest pain, palpitations and leg swelling.  Endocrine: Negative for polydipsia and polyuria.  Neurological: Negative for dizziness, seizures, syncope, weakness, light-headedness and headaches.       Objective:   Physical Exam   Constitutional: She appears well-developed and well-nourished.  Cardiovascular: Normal rate and regular rhythm.   Pulmonary/Chest: Effort normal and breath sounds normal. No respiratory distress. She has no wheezes. She has no rales.  Musculoskeletal: She exhibits no edema.       Assessment:     Type 2 diabetes. History of recent poor control but improving with addition of trulicity    Plan:     -Recheck hemoglobin A1c-6.3% -Continue weight loss efforts -Consider follow-up in 3 months  [name] MD [facility] at [facility]

## 2016-01-27 NOTE — Progress Notes (Signed)
Pre visit review using our clinic review tool, if applicable. No additional management support is needed unless otherwise documented below in the visit note. 

## 2016-02-20 ENCOUNTER — Other Ambulatory Visit: Payer: Self-pay | Admitting: Family Medicine

## 2016-02-20 ENCOUNTER — Telehealth: Payer: Self-pay | Admitting: Family Medicine

## 2016-02-20 MED ORDER — NITROFURANTOIN MONOHYD MACRO 100 MG PO CAPS: 100.0000 mg | ORAL_CAPSULE | Freq: Two times a day (BID) | ORAL | 0 refills | Status: AC

## 2016-02-20 NOTE — Telephone Encounter (Signed)
Pt advised rx sent and to contact office if anything further needed.

## 2016-02-20 NOTE — Telephone Encounter (Signed)
Patient is having increased urinary frequency and pain with urination. She denies hematuria or fever. She would to have something called in for her.   Dr. Elease Hashimoto - Please advise. Thanks!

## 2016-02-20 NOTE — Telephone Encounter (Signed)
[facility] - Client [place] Call Center Patient Name: Sherry Gallagher DOB: 10-07-1955 Initial Comment Caller states she gets UTI every year, she is having one now. Would like something called in. Pain when urinating, frequency. She has the prescription she used in March 2017- that worked. Nitrofurantoin-mono-mcr. 100mg . Nurse Assessment Nurse: Dimas Chyle, RN, Dellis Filbert Date/Time Eilene Ghazi Time): 02/20/2016 9:45:40 AM Confirm and document reason for call. If symptomatic, describe symptoms. ---Caller states she gets UTI every year, she is having one now. Would like something called in. Pain when urinating, frequency. No fever. Symptoms started yesterday. No fever. No flank pain. No blood in urine. Does the patient have any new or worsening symptoms? ---Yes Will a triage be completed? ---Yes Related visit to physician within the last 2 weeks? ---N/A Does the PT have any chronic conditions? (i.e. diabetes, asthma, etc.) ---Yes List chronic conditions. ---Diabetes type 2 Is this a behavioral health or substance abuse call? ---No Guidelines Guideline Title Affirmed Question Affirmed Notes Urinary Symptoms Urinating more frequently than usual (i.e., frequency) Final Disposition User See Physician within [place], RN, [employer] did not want to come in for appointment just wanted to see if PCP would call in Rx for UTI symptoms. Referrals REFERRED TO PCP OFFICE Disagree/Comply: Comply

## 2016-02-20 NOTE — Telephone Encounter (Signed)
We can send in [place] one po bid for 3 days but will need follow up if not resolving by early next week.

## 2016-03-22 ENCOUNTER — Other Ambulatory Visit: Payer: Self-pay | Admitting: Family Medicine

## 2016-04-13 ENCOUNTER — Other Ambulatory Visit: Payer: Self-pay | Admitting: Family Medicine

## 2016-05-10 ENCOUNTER — Other Ambulatory Visit: Payer: Self-pay | Admitting: Family Medicine

## 2016-05-10 NOTE — Telephone Encounter (Signed)
Pt need new Rx for colchicine  Pharm:  [facility]   Pt is in a lot of pain and would like to have it today.

## 2016-05-15 ENCOUNTER — Other Ambulatory Visit: Payer: Self-pay | Admitting: Family Medicine

## 2016-05-31 ENCOUNTER — Ambulatory Visit (INDEPENDENT_AMBULATORY_CARE_PROVIDER_SITE_OTHER): Payer: Managed Care, Other (non HMO) | Admitting: Family Medicine

## 2016-05-31 ENCOUNTER — Encounter: Payer: Self-pay | Admitting: Family Medicine

## 2016-05-31 VITALS — BP 120/70 | HR 105 | Temp 98.9°F | Wt 213.8 lb

## 2016-05-31 DIAGNOSIS — E119 Type 2 diabetes mellitus without complications: Secondary | ICD-10-CM | POA: Diagnosis not present

## 2016-05-31 DIAGNOSIS — M109 Gout, unspecified: Secondary | ICD-10-CM

## 2016-05-31 LAB — POCT GLYCOSYLATED HEMOGLOBIN (HGB A1C): Hemoglobin A1C: 6.2

## 2016-05-31 MED ORDER — INDOMETHACIN 50 MG PO CAPS: 50.0000 mg | ORAL_CAPSULE | Freq: Two times a day (BID) | ORAL | 1 refills | Status: DC

## 2016-05-31 NOTE — Progress Notes (Signed)
Subjective:     Patient ID: Sherry Gallagher, female   DOB: September 21, 1955, 61 y.o.   MRN: [id number]  HPI Patient seen with left foot pain for the past month. She has history of reported gout and states she had what sound like a typical gout flareup a month ago with red swollen painful joint. No injury. No fevers or chills. She took colchicine which did not seem to make much difference. She hesitates to take prednisone because of her diabetes. No neuropathy symptoms  Type 2 diabetes. She has done excellent job with taken some dietary changes and has lost about 8 pounds since last December. Sugars have been stable.  Past Medical History:  Diagnosis Date  . Allergy   . Asthma    seasonal - uses inhalers daily during fall  . Diabetes mellitus    fasting 130s  . GERD (gastroesophageal reflux disease)   . Pneumonia    2 yrs   Past Surgical History:  Procedure Laterality Date  . ABDOMINAL HYSTERECTOMY  99  . BREAST BIOPSY Left 11/10/2012   Procedure: BREAST BIOPSY WITH NEEDLE LOCALIZATION;  Surgeon: Rolm Bookbinder, MD;  Location: [place];  Service: General;  Laterality: Left;  . BREAST SURGERY Bilateral    breast implants  . COLONOSCOPY    . NASAL SINUS SURGERY  1980    reports that she has never smoked. She has never used smokeless tobacco. She reports that she does not drink alcohol or use drugs. family history includes Alcohol abuse in her father; Cancer in her maternal grandmother and paternal grandmother. Allergies  Allergen Reactions  . Penicillins Hives and Swelling     Review of Systems  Constitutional: Negative for chills and fever.       Objective:   Physical Exam  Constitutional: She appears well-developed and well-nourished.  Cardiovascular: Normal rate and regular rhythm.   Pulmonary/Chest: Effort normal and breath sounds normal. No respiratory distress. She has no wheezes. She has no rales.  Musculoskeletal:  has some mild swelling and warmth over her right  metatarsophalangeal joint. No breaks in the skin. No ulceration. Normal sensory function throughout. She has no bony tenderness otherwise.       Assessment:     #1 Pain involving right metatarsophalangeal joint. She has evidence for inflammation consistent with gout. No evidence for infection  #2 type 2 diabetes well controlled with A1c today 6.2%    Plan:     -Patient is reluctant to take prednisone because her diabetes. She agrees to trial of indomethacin 50 mg every 8-12 hours with food as needed. Cautioned about potential for GI upset with this. -Touch base in 1-2 weeks if symptoms not improving -She generally only gets about 1-2 flares per year and is not interested in prophylactic treatment at this time -Continue weight loss efforts -discussed possible dietary triggers for gout.  [name] MD [facility] at [facility]

## 2016-05-31 NOTE — Progress Notes (Signed)
Pre visit review using our clinic review tool, if applicable. No additional management support is needed unless otherwise documented below in the visit note. 

## 2016-05-31 NOTE — Patient Instructions (Signed)

## 2016-06-10 ENCOUNTER — Ambulatory Visit: Payer: Managed Care, Other (non HMO) | Admitting: Podiatry

## 2016-06-12 ENCOUNTER — Other Ambulatory Visit: Payer: Self-pay | Admitting: Family Medicine

## 2016-07-12 ENCOUNTER — Other Ambulatory Visit: Payer: Self-pay | Admitting: Family Medicine

## 2016-07-23 ENCOUNTER — Other Ambulatory Visit: Payer: Self-pay | Admitting: Podiatry

## 2016-07-23 ENCOUNTER — Telehealth: Payer: Self-pay | Admitting: *Deleted

## 2016-07-23 ENCOUNTER — Encounter: Payer: Self-pay | Admitting: Podiatry

## 2016-07-23 ENCOUNTER — Ambulatory Visit (INDEPENDENT_AMBULATORY_CARE_PROVIDER_SITE_OTHER): Payer: Managed Care, Other (non HMO) | Admitting: Podiatry

## 2016-07-23 ENCOUNTER — Ambulatory Visit: Payer: Managed Care, Other (non HMO)

## 2016-07-23 ENCOUNTER — Ambulatory Visit (INDEPENDENT_AMBULATORY_CARE_PROVIDER_SITE_OTHER): Payer: Managed Care, Other (non HMO)

## 2016-07-23 VITALS — BP 114/74 | HR 85 | Resp 16 | Ht 66.0 in | Wt 210.0 lb

## 2016-07-23 DIAGNOSIS — M79671 Pain in right foot: Secondary | ICD-10-CM | POA: Diagnosis not present

## 2016-07-23 DIAGNOSIS — M7750 Other enthesopathy of unspecified foot: Secondary | ICD-10-CM

## 2016-07-23 DIAGNOSIS — M79672 Pain in left foot: Secondary | ICD-10-CM | POA: Diagnosis not present

## 2016-07-23 DIAGNOSIS — M779 Enthesopathy, unspecified: Secondary | ICD-10-CM

## 2016-07-23 DIAGNOSIS — M1000 Idiopathic gout, unspecified site: Secondary | ICD-10-CM

## 2016-07-23 DIAGNOSIS — M778 Other enthesopathies, not elsewhere classified: Secondary | ICD-10-CM

## 2016-07-23 MED ORDER — TRIAMCINOLONE ACETONIDE 10 MG/ML IJ SUSP
10.0000 mg | Freq: Once | INTRAMUSCULAR | Status: AC
  Administered 2016-07-23: 10 mg

## 2016-07-23 NOTE — Progress Notes (Signed)
Subjective:    Patient ID: Sherry Gallagher, female   DOB: 61 y.o.   MRN: [id number]   HPI patient states that she's developed quite a bit of inflammation around the first MPJ left and does not remember specific injury. States her sugar has been running very well with no issues as far as that goes    Review of Systems  All other systems reviewed and are negative.       Objective:  Physical Exam  Cardiovascular: Intact distal pulses.   Musculoskeletal: Normal range of motion.  Neurological: She is alert.  Skin: Skin is warm.  Nursing note and vitals reviewed.  neurovascular status intact muscle strength adequate range of motion within normal limits with patient's left first MPJ being quite sore mostly on the medial side of the joint surface and across the dorsal surface. There is mild warmth in the foot but there was no drainage or active indications of infective process. Found have good digital perfusion well oriented 3     Assessment:   Appears to be inflammatory condition with possibility for local condition versus systemic condition      Plan:    H&P x-rays reviewed and today injected the medial capsule 3 mg Kenalog 5 mg Xylocaine and across the dorsal surface. Instructed on heat ice therapy and wearing supportive shoes and I did sent for blood work to rule out any kind of systemic arthritic condition  X-rays were negative for signs of bone spur formation or indications of hallux limitus condition

## 2016-07-23 NOTE — Telephone Encounter (Signed)
"  I am calling about an order that was put in for this patient for an Arthritic Panel.  That panel no longer exists.  So I am calling to see if we can break it up into separate labs.  Is it okay for [place] to do a Uric Acid, ANA, Sed Rate and Rheumatoid Factor?"  Yes, that is fine.

## 2016-07-23 NOTE — Progress Notes (Signed)
   Subjective:    Patient ID: Sherry Gallagher, female    DOB: 02/13/1955, 61 y.o.   MRN: [id number]  HPI Chief Complaint  Patient presents with  . Toe Pain    Left foot; great toe-joint; pt stated, "By the end of the day, toe is swollen"; x3-4 months; pt Diabetic Type 2; Sugar=did not check today; A1C=6.0      Review of Systems  Eyes: Positive for visual disturbance.  Musculoskeletal: Positive for arthralgias.  All other systems reviewed and are negative.      Objective:   Physical Exam        Assessment & Plan:

## 2016-07-24 LAB — URIC ACID: URIC ACID, SERUM: 8.2 mg/dL — AB (ref 2.5–7.0)

## 2016-07-24 LAB — SEDIMENTATION RATE: Sed Rate: 14 mm/hr (ref 0–30)

## 2016-07-24 LAB — RHEUMATOID FACTOR: Rhuematoid fact SerPl-aCnc: <14 IU/mL (ref <14)

## 2016-07-26 LAB — ANA: Anti Nuclear Antibody(ANA): NEGATIVE

## 2016-07-26 NOTE — Telephone Encounter (Signed)
yes

## 2016-08-25 ENCOUNTER — Ambulatory Visit (INDEPENDENT_AMBULATORY_CARE_PROVIDER_SITE_OTHER): Payer: Managed Care, Other (non HMO) | Admitting: Family Medicine

## 2016-08-25 ENCOUNTER — Encounter: Payer: Self-pay | Admitting: Family Medicine

## 2016-08-25 VITALS — BP 138/80 | HR 100 | Temp 97.8°F | Ht 66.0 in | Wt 207.4 lb

## 2016-08-25 DIAGNOSIS — E119 Type 2 diabetes mellitus without complications: Secondary | ICD-10-CM | POA: Diagnosis not present

## 2016-08-25 DIAGNOSIS — R300 Dysuria: Secondary | ICD-10-CM | POA: Diagnosis not present

## 2016-08-25 LAB — POC URINALSYSI DIPSTICK (AUTOMATED)
BILIRUBIN UA: NEGATIVE
GLUCOSE UA: NEGATIVE
KETONES UA: NEGATIVE
Leukocytes, UA: NEGATIVE
Nitrite, UA: NEGATIVE
Protein, UA: NEGATIVE
RBC UA: NEGATIVE
Spec Grav, UA: 1.01 (ref 1.010–1.025)
Urobilinogen, UA: 0.2 E.U./dL
pH, UA: 6 (ref 5.0–8.0)

## 2016-08-25 LAB — POCT GLYCOSYLATED HEMOGLOBIN (HGB A1C): Hemoglobin A1C: 6

## 2016-08-25 MED ORDER — NITROFURANTOIN MONOHYD MACRO 100 MG PO CAPS: 100.0000 mg | ORAL_CAPSULE | Freq: Two times a day (BID) | ORAL | 0 refills | Status: DC

## 2016-08-25 NOTE — Progress Notes (Signed)
Subjective:     Patient ID: Sherry Gallagher, female   DOB: Aug 28, 1955, 60 y.o.   MRN: [id number]  HPI Patient is here with onset early this morning of some mild dysuria. Burning with urination. Symptoms somewhat inconsistently today. No gross hematuria. No flank pain. No fevers or chills. Last UTI was over one year ago  Type 2 diabetes. Continued success with weight loss. She has lost about 30 pounds overall this year. Not monitoring sugars regularly. Requesting A1c. Last A1c in April 6.2%.  Past Medical History:  Diagnosis Date  . Allergy   . Asthma    seasonal - uses inhalers daily during fall  . Diabetes mellitus    fasting 130s  . GERD (gastroesophageal reflux disease)   . Pneumonia    2 yrs   Past Surgical History:  Procedure Laterality Date  . ABDOMINAL HYSTERECTOMY  99  . BREAST BIOPSY Left 11/10/2012   Procedure: BREAST BIOPSY WITH NEEDLE LOCALIZATION;  Surgeon: Rolm Bookbinder, MD;  Location: [place];  Service: General;  Laterality: Left;  . BREAST SURGERY Bilateral    breast implants  . COLONOSCOPY    . NASAL SINUS SURGERY  1980    reports that she has never smoked. She has never used smokeless tobacco. She reports that she does not drink alcohol or use drugs. family history includes Alcohol abuse in her father; Cancer in her maternal grandmother and paternal grandmother. Allergies  Allergen Reactions  . Penicillins Hives and Swelling     Review of Systems  Constitutional: Negative for appetite change, chills and fever.  Gastrointestinal: Negative for abdominal pain, constipation, diarrhea, nausea and vomiting.  Genitourinary: Positive for dysuria and frequency.  Musculoskeletal: Negative for back pain.  Neurological: Negative for dizziness.       Objective:   Physical Exam  Constitutional: She appears well-developed and well-nourished.  HENT:  Head: Normocephalic and atraumatic.  Neck: Neck supple. No thyromegaly present.  Cardiovascular: Normal rate,  regular rhythm and normal heart sounds.   Pulmonary/Chest: Breath sounds normal.  Abdominal: Soft. Bowel sounds are normal. There is no tenderness.       Assessment:     #1 dysuria. Urine dipstick is completely normal which makes likelihood of UTI slim  #2 type 2 diabetes which has been well controlled    Plan:     -Recheck hemoglobin A1c=6.0% -Continue weight loss efforts -Printed prescription for Macrobid to start only if she has persistent or worsening symptoms -Routine follow-up 6 months and sooner as needed  [name] MD [facility] at [facility]

## 2016-09-03 ENCOUNTER — Other Ambulatory Visit: Payer: Self-pay | Admitting: Family Medicine

## 2016-10-05 ENCOUNTER — Other Ambulatory Visit: Payer: Self-pay | Admitting: Obstetrics & Gynecology

## 2016-10-05 DIAGNOSIS — N6310 Unspecified lump in the right breast, unspecified quadrant: Secondary | ICD-10-CM

## 2016-10-12 ENCOUNTER — Ambulatory Visit
Admission: RE | Admit: 2016-10-12 | Discharge: 2016-10-12 | Disposition: A | Discharge status: Home / Self Care | Payer: Managed Care, Other (non HMO) | Source: Ambulatory Visit | Attending: Obstetrics & Gynecology | Admitting: Obstetrics & Gynecology

## 2016-10-12 DIAGNOSIS — N6310 Unspecified lump in the right breast, unspecified quadrant: Secondary | ICD-10-CM

## 2016-10-13 ENCOUNTER — Other Ambulatory Visit: Payer: Self-pay | Admitting: Podiatry

## 2016-10-13 ENCOUNTER — Ambulatory Visit (INDEPENDENT_AMBULATORY_CARE_PROVIDER_SITE_OTHER): Payer: Managed Care, Other (non HMO)

## 2016-10-13 ENCOUNTER — Encounter: Payer: Self-pay | Admitting: Podiatry

## 2016-10-13 ENCOUNTER — Ambulatory Visit (INDEPENDENT_AMBULATORY_CARE_PROVIDER_SITE_OTHER): Payer: Managed Care, Other (non HMO) | Admitting: Podiatry

## 2016-10-13 DIAGNOSIS — M779 Enthesopathy, unspecified: Secondary | ICD-10-CM | POA: Diagnosis not present

## 2016-10-13 DIAGNOSIS — M1000 Idiopathic gout, unspecified site: Secondary | ICD-10-CM

## 2016-10-13 DIAGNOSIS — M79671 Pain in right foot: Secondary | ICD-10-CM | POA: Diagnosis not present

## 2016-10-13 DIAGNOSIS — M778 Other enthesopathies, not elsewhere classified: Secondary | ICD-10-CM

## 2016-10-13 DIAGNOSIS — M7750 Other enthesopathy of unspecified foot: Secondary | ICD-10-CM

## 2016-10-13 MED ORDER — TRIAMCINOLONE ACETONIDE 10 MG/ML IJ SUSP
10.0000 mg | Freq: Once | INTRAMUSCULAR | Status: AC
  Administered 2016-10-13: 10 mg

## 2016-10-13 MED ORDER — ALLOPURINOL 100 MG PO TABS: 100.0000 mg | ORAL_TABLET | Freq: Every day | ORAL | 6 refills | Status: DC

## 2016-10-13 NOTE — Patient Instructions (Signed)
Calcium Pyrophosphate Deposition  Calcium pyrophosphate deposition (CPPD), which is also called pseudogout, is a type of arthritis that causes pain, swelling, and inflammation in a joint. The joint pain can be severe and may last for days. If it is not treated, the pain may last much longer. Attacks of CPPD may come and go. This condition usually affects one joint at a time. The joints that are affected most commonly are the knees, but this condition can also affect the wrists, elbows, shoulders, or ankles.  CPPD is similar to gout. Both conditions result from the buildup of crystals in the joint. However, CPPD is caused by a type of crystal that is different than the crystals that cause gout.  What are the causes?  This condition is caused by the buildup of calcium pyrophosphate dihydrate crystals in the joint. The reason why this buildup occurs is not known. The condition may be passed down from parent to child (hereditary).  What increases the risk?  This condition is more likely to develop in people who:  · Are over 60 years old.  · Have a family history of the condition.  · Have had joint replacement surgery.  · Have had a recent injury.  · Have certain medical conditions, such as hemophilia, ochronosis, amyloidosis, or hormonal disorders.  · Have low blood magnesium levels.    What are the signs or symptoms?  Symptoms of this condition include:  · Pain in a joint. The pain may:  ? Be intense and constant.  ? Come on quickly.  ? Get worse with movement.  ? Last from several days to a few weeks.  · Redness, swelling, and warmth at the joint.  · Stiffness of the joint.    How is this diagnosed?  To diagnose this condition, your health care provider will use a needle to remove fluid from the joint. The fluid will be examined under a microscope to check for the crystals that cause CPPD. You may also have imaging tests, such as:  · X-rays.  · Ultrasound.    How is this treated?  There is no way to remove the  crystals from the joint and no way to cure this condition. However, treatment can relieve symptoms and improve joint function. Treatment may include:  · Nonsteroidal anti-inflammatory drugs (NSAIDs) to reduce inflammation and pain.  · Medicines to help prevent attacks.  · Injections of medicine (cortisone) into the joint to reduce pain and swelling.  · Physical therapy to improve joint function.    Follow these instructions at home:  · Take medicines only as directed by your health care provider.  · Rest the affected joints until your symptoms start to go away.  · Keep your affected joints raised (elevated) when possible. This will help to reduce swelling.  · If directed, apply ice to the affected area:  ? Put ice in a plastic bag.  ? Place a towel between your skin and the bag.  ? Leave the ice on for 20 minutes, 2-3 times per day.  · If the painful joint is in your leg, use crutches as directed by your health care provider.  · When your symptoms start to go away, begin to exercise regularly or do physical therapy. Talk with your health care provider or physical therapist about what types of exercise are safe for you. Low-impact exercise may be best. This includes walking, swimming, bicycling, and water aerobics.  · Maintain a healthy weight so your joints do not   need to bear more weight than necessary.  Contact a health care provider if:  · You have an increase in joint pain that is not relieved with medicine.  · Your joint becomes more red, swollen, or stiff.  · You have a fever.  · You have a skin rash.  This information is not intended to replace advice given to you by your health care provider. Make sure you discuss any questions you have with your health care provider.  Document Released: 10/18/2003 Document Revised: 07/03/2015 Document Reviewed: 01/02/2014  Elsevier Interactive Patient Education © 2018 [employer] Inc[employer]

## 2016-10-14 NOTE — Progress Notes (Signed)
Subjective:    Patient ID: Sherry Gallagher, female   DOB: 61 y.o.   MRN: [id number]   HPI patient states her right foot has gotten very swollen and she feels like it's a gout attack and it felt like that when it started and it's been worse in the toe joint    ROS      Objective:  Physical Exam neurovascular status intact with patient found to have edema in the forefoot right negative Homans sign was noted with quite a bit of inflammation centered around the third metatarsophalangeal joint of the right foot with pain when palpated. The pain does extend in the midfoot     Assessment:    Probability for acute inflammatory gout third MPJ right with inflammatory condition and possibility that we cannot rule out stress fracture     Plan:   H&P condition reviewed and today I did a proximal nerve block right I then aspirated the third MPJ getting out of a small amount of bloody fluid that I am sending for evaluation for uric acid crystals and I then injected half cc deck some some Kenalog into the joint. Applied Unna boot to reduce the swelling to the foot and instructed on elevation and reappoint to recheck again in the next several weeks  X-Sherry was negative for signs of stress fracture or arthritic issue appears to be inflammatory

## 2016-10-18 ENCOUNTER — Other Ambulatory Visit: Payer: Self-pay | Admitting: Obstetrics & Gynecology

## 2016-10-18 DIAGNOSIS — T8543XD Leakage of breast prosthesis and implant, subsequent encounter: Secondary | ICD-10-CM

## 2016-10-27 ENCOUNTER — Ambulatory Visit
Admission: RE | Admit: 2016-10-27 | Discharge: 2016-10-27 | Disposition: A | Discharge status: Home / Self Care | Payer: Managed Care, Other (non HMO) | Source: Ambulatory Visit | Attending: Obstetrics & Gynecology | Admitting: Obstetrics & Gynecology

## 2016-10-27 DIAGNOSIS — T8543XD Leakage of breast prosthesis and implant, subsequent encounter: Secondary | ICD-10-CM

## 2016-10-28 ENCOUNTER — Encounter: Payer: Self-pay | Admitting: Family Medicine

## 2016-11-29 ENCOUNTER — Other Ambulatory Visit: Payer: Self-pay | Admitting: Family Medicine

## 2016-12-07 ENCOUNTER — Other Ambulatory Visit: Payer: Self-pay | Admitting: Family Medicine

## 2017-01-12 ENCOUNTER — Other Ambulatory Visit: Payer: Self-pay | Admitting: Plastic Surgery

## 2017-02-16 ENCOUNTER — Ambulatory Visit: Payer: Managed Care, Other (non HMO) | Admitting: Family Medicine

## 2017-02-16 ENCOUNTER — Encounter: Payer: Self-pay | Admitting: Family Medicine

## 2017-02-16 VITALS — BP 110/70 | HR 94 | Temp 98.4°F | Wt 213.3 lb

## 2017-02-16 DIAGNOSIS — J029 Acute pharyngitis, unspecified: Secondary | ICD-10-CM

## 2017-02-16 LAB — POCT RAPID STREP A (OFFICE): Rapid Strep A Screen: NEGATIVE

## 2017-02-16 MED ORDER — AZITHROMYCIN 250 MG PO TABS: ORAL_TABLET | ORAL | 0 refills | Status: DC

## 2017-02-16 MED ORDER — AZITHROMYCIN 250 MG PO TABS: ORAL_TABLET | ORAL | 0 refills | Status: AC

## 2017-02-16 NOTE — Progress Notes (Signed)
Subjective:     Patient ID: Sherry Gallagher, female   DOB: Jun 15, 1955, 62 y.o.   MRN: [id number]  HPI Patient seen with predominant symptom of sore throat for past couple days. No cough. Minimal nasal congestion. Question low-grade fever at night past couple days. No headaches. Denies any nausea or vomiting. She works in administration at [employer].  Past Medical History:  Diagnosis Date  . Allergy   . Asthma    seasonal - uses inhalers daily during fall  . Diabetes mellitus    fasting 130s  . GERD (gastroesophageal reflux disease)   . Pneumonia    2 yrs   Past Surgical History:  Procedure Laterality Date  . ABDOMINAL HYSTERECTOMY  99  . AUGMENTATION MAMMAPLASTY Bilateral    30 years ago   . BREAST BIOPSY Left 11/10/2012   Procedure: BREAST BIOPSY WITH NEEDLE LOCALIZATION;  Surgeon: Rolm Bookbinder, MD;  Location: [place];  Service: General;  Laterality: Left;  . BREAST SURGERY Bilateral    breast implants  . COLONOSCOPY    . NASAL SINUS SURGERY  1980    reports that  has never smoked. she has never used smokeless tobacco. She reports that she does not drink alcohol or use drugs. family history includes Alcohol abuse in her father; Cancer in her maternal grandmother and paternal grandmother. Allergies  Allergen Reactions  . Penicillins Hives and Swelling     Review of Systems  Constitutional: Positive for chills, fatigue and fever.  HENT: Positive for sore throat. Negative for congestion.   Respiratory: Negative for cough.   Neurological: Negative for headaches.       Objective:   Physical Exam  Constitutional: She appears well-developed and well-nourished.  HENT:  Right Ear: External ear normal.  Left Ear: External ear normal.  Does have posterior pharynx erythema. Question of some early exudate left tonsil. No evidence for peritonsillar abscess  Neck: Neck supple.  Minimal anterior cervical adenopathy  Cardiovascular: Normal rate and regular  rhythm.  Pulmonary/Chest: Effort normal and breath sounds normal. No respiratory distress. She has no wheezes. She has no rales.       Assessment:     Acute pharyngitis. Rapid strep negative. Given lack of cough and nasal symptoms concern for possible bacterial pharyngitis    Plan:     -Discussed pros and cons of throat culture but explained this would take a few days to get back -She is penicillin allergic. We elected to go and cover with Zithromax for 5 days  [name] MD [facility] at [facility]

## 2017-02-16 NOTE — Patient Instructions (Signed)

## 2017-02-21 ENCOUNTER — Other Ambulatory Visit: Payer: Self-pay | Admitting: Family Medicine

## 2017-03-08 ENCOUNTER — Ambulatory Visit: Payer: Managed Care, Other (non HMO) | Admitting: Family Medicine

## 2017-03-08 ENCOUNTER — Encounter: Payer: Self-pay | Admitting: Family Medicine

## 2017-03-08 VITALS — BP 110/68 | HR 100 | Temp 98.3°F | Wt 211.7 lb

## 2017-03-08 DIAGNOSIS — R197 Diarrhea, unspecified: Secondary | ICD-10-CM

## 2017-03-08 LAB — CBC WITH DIFFERENTIAL/PLATELET
BASOS ABS: 0.1 10*3/uL (ref 0.0–0.1)
Basophils Relative: 0.6 % (ref 0.0–3.0)
EOS PCT: 0.5 % (ref 0.0–5.0)
Eosinophils Absolute: 0.1 10*3/uL (ref 0.0–0.7)
HEMATOCRIT: 40.6 % (ref 36.0–46.0)
HEMOGLOBIN: 13.5 g/dL (ref 12.0–15.0)
LYMPHS ABS: 1.8 10*3/uL (ref 0.7–4.0)
LYMPHS PCT: 17.9 % (ref 12.0–46.0)
MCHC: 33.2 g/dL (ref 30.0–36.0)
MCV: 92.2 fl (ref 78.0–100.0)
MONOS PCT: 10.5 % (ref 3.0–12.0)
Monocytes Absolute: 1.1 10*3/uL — ABNORMAL HIGH (ref 0.1–1.0)
NEUTROS PCT: 70.5 % (ref 43.0–77.0)
Neutro Abs: 7.2 10*3/uL (ref 1.4–7.7)
Platelets: 436 10*3/uL — ABNORMAL HIGH (ref 150.0–400.0)
RBC: 4.4 Mil/uL (ref 3.87–5.11)
RDW: 13.6 % (ref 11.5–15.5)
WBC: 10.2 10*3/uL (ref 4.0–10.5)

## 2017-03-08 MED ORDER — VANCOMYCIN HCL 125 MG PO CAPS: 125.0000 mg | ORAL_CAPSULE | Freq: Four times a day (QID) | ORAL | 0 refills | Status: DC

## 2017-03-08 NOTE — Progress Notes (Signed)
Subjective:     Patient ID: Sherry Gallagher, female   DOB: [birth year], 62 y.o.   MRN: [id number]  HPI Patient seen with chief complaint of 2 day history of bloody diarrhea. She had recent severe sore throat and was treated with Zithromax. Her sore throat symptoms are improved. Last weekend she noted some decreased appetite. Thinks she had some low-grade fever over the weekend. She did not have any diarrhea until yesterday. She had some bloody diarrhea and described this as "maroon" colored with very loose stool watery stool. She was not aware of any fever yesterday. She is keeping down fluids. She's had some increased malaise.  Today's had about 3 similar stools today. Denies any abdominal pain. No use of Imodium. No recent laxative use. No recent hospitalization.  Colonoscopy 2014. No reported diverticulosis changes. No prior history of C. Difficile. No recent travels  Past Medical History:  Diagnosis Date  . Allergy   . Asthma    seasonal - uses inhalers daily during fall  . Diabetes mellitus    fasting 130s  . GERD (gastroesophageal reflux disease)   . Pneumonia    2 yrs   Past Surgical History:  Procedure Laterality Date  . ABDOMINAL HYSTERECTOMY  99  . AUGMENTATION MAMMAPLASTY Bilateral    30 years ago   . BREAST BIOPSY Left 11/10/2012   Procedure: BREAST BIOPSY WITH NEEDLE LOCALIZATION;  Surgeon: Rolm Bookbinder, MD;  Location: [place];  Service: General;  Laterality: Left;  . BREAST SURGERY Bilateral    breast implants  . COLONOSCOPY    . NASAL SINUS SURGERY  1980    reports that  has never smoked. she has never used smokeless tobacco. She reports that she does not drink alcohol or use drugs. family history includes Alcohol abuse in her father; Cancer in her maternal grandmother and paternal grandmother. Allergies  Allergen Reactions  . Penicillins Hives and Swelling     Review of Systems  Constitutional: Positive for appetite change, chills, fatigue and fever.   Respiratory: Negative for cough.   Gastrointestinal: Positive for diarrhea. Negative for abdominal pain, nausea and vomiting.       Objective:   Physical Exam  Constitutional: She appears well-developed and well-nourished.  HENT:  Mouth/Throat: Oropharynx is clear and moist.  Cardiovascular: Normal rate and regular rhythm.  Pulmonary/Chest: Effort normal and breath sounds normal. No respiratory distress. She has no wheezes. She has no rales.  Abdominal: Soft. Bowel sounds are normal. She exhibits no distension. There is no tenderness. There is no rebound and no guarding.  Genitourinary:  Genitourinary Comments: Rectal exam no anal fissure. Minimal external hemorrhoids non-bleeding. Digital exam no mass. Scant brownish stool. Trace Hemoccult positive       Assessment:     Patient presents with 2 day history of bloody diarrhea. No history of diverticulosis. Doubt hemorrhoidal as her color was more maroon. She has no history of reported diverticulosis on prior colonoscopy. Have cautioned about potential C. Difficile with recent antibiotic use    Plan:     -handout on appropriate diet given -avoid anti-diarrheals such as Imodium. -Check CBC with differential -Send stool for C. Difficile PCR - Vancomycin 125 mg 4 times a day pending C. Difficile analysis  [name] MD [facility] at [facility]

## 2017-03-08 NOTE — Patient Instructions (Signed)

## 2017-03-11 LAB — CLOSTRIDIUM DIFFICILE BY PCR: CDIFFPCR: NEGATIVE

## 2017-03-12 ENCOUNTER — Other Ambulatory Visit: Payer: Self-pay | Admitting: Family Medicine

## 2017-03-16 ENCOUNTER — Telehealth: Payer: Self-pay | Admitting: Family Medicine

## 2017-03-16 MED ORDER — OSELTAMIVIR PHOSPHATE 75 MG PO CAPS: 75.0000 mg | ORAL_CAPSULE | Freq: Every day | ORAL | 0 refills | Status: DC

## 2017-03-16 NOTE — Telephone Encounter (Signed)
Can start prophylactic dose of Tamiflu 75 mg once daily for 10 days

## 2017-03-16 NOTE — Telephone Encounter (Signed)
Rx sent and patient is aware. 

## 2017-03-16 NOTE — Telephone Encounter (Signed)
Copied from [place] [id number]. Topic: Quick Communication - See Telephone Encounter >> Mar 16, 2017  8:46 AM Aurelio Brash B wrote: CRM for notification. See Telephone encounter for:  Pt has been exposed to the flu and is asking for tamiflu be sent to   [facility], [place] - [place], [place] - [address] [phone] (Phone) [phone] (Fax)   03/16/17.

## 2017-03-22 ENCOUNTER — Other Ambulatory Visit: Payer: Self-pay | Admitting: Family Medicine

## 2017-07-07 ENCOUNTER — Telehealth: Payer: Self-pay | Admitting: Family Medicine

## 2017-07-07 NOTE — Telephone Encounter (Signed)
Copied from [place] [id number]. Topic: Quick Communication - Rx Refill/Question >> Jul 07, 2017  2:48 PM Ether Griffins B wrote: Medication: metFORMIN (GLUCOPHAGE) 1000 MG tablet   Pt has CPE scheduled for 07/19/17.   Has the patient contacted their pharmacy? Yes.   (Agent: If no, request that the patient contact the pharmacy for the refill.) (Agent: If yes, when and what did the pharmacy advise?)  Preferred Pharmacy (with phone number or street name): [place], [place]  Agent: Please be advised that RX refills may take up to 3 business days. We ask that you follow-up with your pharmacy.

## 2017-07-08 ENCOUNTER — Other Ambulatory Visit: Payer: Self-pay

## 2017-07-08 MED ORDER — METFORMIN HCL 1000 MG PO TABS: ORAL_TABLET | ORAL | 0 refills | Status: DC

## 2017-07-19 ENCOUNTER — Ambulatory Visit (INDEPENDENT_AMBULATORY_CARE_PROVIDER_SITE_OTHER): Payer: Managed Care, Other (non HMO) | Admitting: Family Medicine

## 2017-07-19 ENCOUNTER — Encounter: Payer: Self-pay | Admitting: Family Medicine

## 2017-07-19 VITALS — BP 110/70 | Ht 66.0 in | Wt 219.2 lb

## 2017-07-19 DIAGNOSIS — Z0000 Encounter for general adult medical examination without abnormal findings: Secondary | ICD-10-CM

## 2017-07-19 MED ORDER — FLUTICASONE-SALMETEROL 250-50 MCG/DOSE IN AEPB: INHALATION_SPRAY | RESPIRATORY_TRACT | 2 refills | Status: DC

## 2017-07-19 MED ORDER — DULAGLUTIDE 0.75 MG/0.5ML ~~LOC~~ SOAJ: SUBCUTANEOUS | 5 refills | Status: DC

## 2017-07-19 MED ORDER — ALBUTEROL SULFATE HFA 108 (90 BASE) MCG/ACT IN AERS: INHALATION_SPRAY | RESPIRATORY_TRACT | 5 refills | Status: DC

## 2017-07-19 MED ORDER — BENAZEPRIL-HYDROCHLOROTHIAZIDE 20-25 MG PO TABS: 0.5000 | ORAL_TABLET | Freq: Every day | ORAL | 3 refills | Status: DC

## 2017-07-19 NOTE — Patient Instructions (Signed)
Iliotibial Band Syndrome Rehab  Ask your health care provider which exercises are safe for you. Do exercises exactly as told by your health care provider and adjust them as directed. It is normal to feel mild stretching, pulling, tightness, or discomfort as you do these exercises, but you should stop right away if you feel sudden pain or your pain gets worse. Do not begin these exercises until told by your health care provider.  Stretching and range of motion exercises  These exercises warm up your muscles and joints and improve the movement and flexibility of your hip and pelvis.  Exercise A: Quadriceps, prone    1. Lie on your abdomen on a firm surface, such as a bed or padded floor.  2. Bend your left / right knee and hold your ankle. If you cannot reach your ankle or pant leg, loop a belt around your foot and grab the belt instead.  3. Gently pull your heel toward your buttocks. Your knee should not slide out to the side. You should feel a stretch in the front of your thigh and knee.  4. Hold this position for __________ seconds.  Repeat __________ times. Complete this stretch __________ times a day.  Exercise B: Iliotibial band    1. Lie on your side with your left / right leg in the top position.  2. Bend both of your knees and grab your left / right ankle. Stretch out your bottom arm to help you balance.  3. Slowly bring your top knee back so your thigh goes behind your trunk.  4. Slowly lower your top leg toward the floor until you feel a gentle stretch on the outside of your left / right hip and thigh. If you do not feel a stretch and your knee will not fall farther, place the heel of your other foot on top of your knee and pull your knee down toward the floor with your foot.  5. Hold this position for __________ seconds.  Repeat __________ times. Complete this stretch __________ times a day.  Strengthening exercises  These exercises build strength and endurance in your hip and pelvis. Endurance is the  ability to use your muscles for a long time, even after they get tired.  Exercise C: Straight leg raises (  hip abductors)  1. Lie on your side with your left / right leg in the top position. Lie so your head, shoulder, knee, and hip line up. You may bend your bottom knee to help you balance.  2. Roll your hips slightly forward so your hips are stacked directly over each other and your left / right knee is facing forward.  3. Tense the muscles in your outer thigh and lift your top leg 4-6 inches (10-15 cm).  4. Hold this position for __________ seconds.  5. Slowly return to the starting position. Let your muscles relax completely before doing another repetition.  Repeat __________ times. Complete this exercise __________ times a day.  Exercise D: Straight leg raises (  hip extensors)  1. Lie on your abdomen on your bed or a firm surface. You can put a pillow under your hips if that is more comfortable.  2. Bend your left / right knee so your foot is straight up in the air.  3. Squeeze your buttock muscles and lift your left / right thigh off the bed. Do not let your back arch.  4. Tense this muscle as hard as you can without increasing any knee pain.    5. Hold this position for __________ seconds.  6. Slowly lower your leg to the starting position and allow it to relax completely.  Repeat __________ times. Complete this exercise __________ times a day.  Exercise E: Hip hike  1. Stand sideways on a bottom step. Stand on your left / right leg with your other foot unsupported next to the step. You can hold onto the railing or wall if needed for balance.  2. Keep your knees straight and your torso square. Then, lift your left / right hip up toward the ceiling.  3. Slowly let your left / right hip lower toward the floor, past the starting position. Your foot should get closer to the floor. Do not lean or bend your knees.  Repeat __________ times. Complete this exercise __________ times a day.  This information is not  intended to replace advice given to you by your health care provider. Make sure you discuss any questions you have with your health care provider.  Document Released: 01/25/2005 Document Revised: 09/30/2015 Document Reviewed: 12/27/2014  Elsevier Interactive Patient Education © 2018 Elsevier Inc.

## 2017-07-19 NOTE — Progress Notes (Signed)
Subjective:     Patient ID: Sherry Gallagher, female   DOB: 1955/10/31, 62 y.o.   MRN: [id number]  HPI Patient seen for physical exam. Her chronic problems include history of type 2 diabetes, gout, hyperlipidemia, hypertension.  She's had poor compliance recently. Until just very recently had decrease swimming for several months. She has gained some weight. She states her blood sugars though have been fairly stable. She still sees gynecologist regularly for her Pap smears. She is getting regular mammograms. She has had recent eye exam back in October. Colonoscopy up-to-date. No history of shingles vaccine  Past Medical History:  Diagnosis Date  . Allergy   . Asthma    seasonal - uses inhalers daily during fall  . Diabetes mellitus    fasting 130s  . GERD (gastroesophageal reflux disease)   . Pneumonia    2 yrs   Past Surgical History:  Procedure Laterality Date  . ABDOMINAL HYSTERECTOMY  99  . AUGMENTATION MAMMAPLASTY Bilateral    30 years ago   . BREAST BIOPSY Left 11/10/2012   Procedure: BREAST BIOPSY WITH NEEDLE LOCALIZATION;  Surgeon: Rolm Bookbinder, MD;  Location: [facility];  Service: General;  Laterality: Left;  . BREAST SURGERY Bilateral    breast implants  . COLONOSCOPY    . NASAL SINUS SURGERY  1980    reports that she has never smoked. She has never used smokeless tobacco. She reports that she does not drink alcohol or use drugs. family history includes Alcohol abuse in her father; Cancer in her maternal grandmother and paternal grandmother. Allergies  Allergen Reactions  . Penicillins Hives and Swelling     Review of Systems  Constitutional: Negative for activity change, appetite change, fatigue, fever and unexpected weight change.  HENT: Negative for ear pain, hearing loss, sore throat and trouble swallowing.   Eyes: Negative for visual disturbance.  Respiratory: Negative for cough and shortness of breath.   Cardiovascular: Negative for chest pain and  palpitations.  Gastrointestinal: Negative for abdominal pain, blood in stool, constipation and diarrhea.  Genitourinary: Negative for dysuria and hematuria.  Musculoskeletal: Negative for arthralgias, back pain and myalgias.  Skin: Negative for rash.  Neurological: Negative for dizziness, syncope and headaches.  Hematological: Negative for adenopathy.  Psychiatric/Behavioral: Negative for confusion and dysphoric mood.       Objective:   Physical Exam  Constitutional: She is oriented to person, place, and time. She appears well-developed and well-nourished.  HENT:  Head: Normocephalic and atraumatic.  Eyes: Pupils are equal, round, and reactive to light. EOM are normal.  Neck: Normal range of motion. Neck supple. No thyromegaly present.  Cardiovascular: Normal rate, regular rhythm and normal heart sounds.  No murmur heard. Pulmonary/Chest: Breath sounds normal. No respiratory distress. She has no wheezes. She has no rales.  Abdominal: Soft. Bowel sounds are normal. She exhibits no distension and no mass. There is no tenderness. There is no rebound and no guarding.  Musculoskeletal: Normal range of motion. She exhibits no edema.  Lymphadenopathy:    She has no cervical adenopathy.  Neurological: She is alert and oriented to person, place, and time. She displays normal reflexes. No cranial nerve deficit.  Skin: No rash noted.  Psychiatric: She has a normal mood and affect. Her behavior is normal. Judgment and thought content normal.       Assessment:     Physical exam. The following issues were addressed    Plan:     -Obtain lab work including hepatitis C antibody  and hemoglobin A1c  -Discussed new shingles vaccine and she will be placed on waiting list  -She is encouraged to resume her regular exercise habits and lose some weight  -Refilled several medications today  [name] MD [facility] at [facility]

## 2017-07-20 LAB — LIPID PANEL
CHOL/HDL RATIO: 3
Cholesterol: 183 mg/dL (ref 0–200)
HDL: 53.7 mg/dL (ref >39.00)
LDL CALC: 96 mg/dL (ref 0–99)
NonHDL: 129.18
Triglycerides: 166 mg/dL — ABNORMAL HIGH (ref 0.0–149.0)
VLDL: 33.2 mg/dL (ref 0.0–40.0)

## 2017-07-20 LAB — CBC WITH DIFFERENTIAL/PLATELET
Basophils Absolute: 0.1 10*3/uL (ref 0.0–0.1)
Basophils Relative: 1.4 % (ref 0.0–3.0)
EOS ABS: 0.3 10*3/uL (ref 0.0–0.7)
EOS PCT: 3.5 % (ref 0.0–5.0)
HCT: 39.6 % (ref 36.0–46.0)
Hemoglobin: 13.4 g/dL (ref 12.0–15.0)
LYMPHS ABS: 3 10*3/uL (ref 0.7–4.0)
Lymphocytes Relative: 36.1 % (ref 12.0–46.0)
MCHC: 33.9 g/dL (ref 30.0–36.0)
MCV: 92.4 fl (ref 78.0–100.0)
MONO ABS: 0.6 10*3/uL (ref 0.1–1.0)
Monocytes Relative: 7.1 % (ref 3.0–12.0)
NEUTROS PCT: 51.9 % (ref 43.0–77.0)
Neutro Abs: 4.3 10*3/uL (ref 1.4–7.7)
Platelets: 388 10*3/uL (ref 150.0–400.0)
RBC: 4.29 Mil/uL (ref 3.87–5.11)
RDW: 13.7 % (ref 11.5–15.5)
WBC: 8.3 10*3/uL (ref 4.0–10.5)

## 2017-07-20 LAB — HEPATITIS C ANTIBODY
Hepatitis C Ab: NONREACTIVE
SIGNAL TO CUT-OFF: 0.02 (ref <1.00)

## 2017-07-20 LAB — BASIC METABOLIC PANEL
BUN: 14 mg/dL (ref 6–23)
CHLORIDE: 100 meq/L (ref 96–112)
CO2: 29 meq/L (ref 19–32)
Calcium: 9.9 mg/dL (ref 8.4–10.5)
Creatinine, Ser: 0.62 mg/dL (ref 0.40–1.20)
GFR: 103.68 mL/min (ref >60.00)
GLUCOSE: 95 mg/dL (ref 70–99)
POTASSIUM: 4.2 meq/L (ref 3.5–5.1)
SODIUM: 140 meq/L (ref 135–145)

## 2017-07-20 LAB — HEPATIC FUNCTION PANEL
ALBUMIN: 4.4 g/dL (ref 3.5–5.2)
ALK PHOS: 76 U/L (ref 39–117)
ALT: 17 U/L (ref 0–35)
AST: 16 U/L (ref 0–37)
BILIRUBIN TOTAL: 0.5 mg/dL (ref 0.2–1.2)
Bilirubin, Direct: 0.1 mg/dL (ref 0.0–0.3)
Total Protein: 7.1 g/dL (ref 6.0–8.3)

## 2017-07-20 LAB — TSH: TSH: 2.32 u[IU]/mL (ref 0.35–4.50)

## 2017-07-20 LAB — HEMOGLOBIN A1C: HEMOGLOBIN A1C: 7 % — AB (ref 4.6–6.5)

## 2017-08-05 ENCOUNTER — Other Ambulatory Visit: Payer: Self-pay | Admitting: Family Medicine

## 2017-08-17 ENCOUNTER — Other Ambulatory Visit: Payer: Self-pay | Admitting: *Deleted

## 2017-08-17 MED ORDER — PRAVASTATIN SODIUM 40 MG PO TABS: 40.0000 mg | ORAL_TABLET | Freq: Every day | ORAL | 2 refills | Status: DC

## 2017-09-01 ENCOUNTER — Telehealth: Payer: Self-pay | Admitting: Family Medicine

## 2017-09-01 NOTE — Telephone Encounter (Signed)
Nebulizer last filled in 2014 by Dr. Sarajane Jews. Called patient and verified that she is wanting the nebulizer and not inhaler, as inhaler was filled in June w/ refills. Dr. Elease Hashimoto, please advise.

## 2017-09-01 NOTE — Telephone Encounter (Signed)
OK to refill Albuterol for nebulizer.

## 2017-09-01 NOTE — Telephone Encounter (Signed)
Copied from [place] [id number]. Topic: General - Other >> Sep 01, 2017  8:54 AM Lennox Solders wrote: Reason for CRM: pt is calling and needs new rx albuterol nebulizer solution 2.5 mg. Kristopher Oppen[address]. Pt has physical with burchette in June 2019

## 2017-09-02 MED ORDER — ALBUTEROL SULFATE (2.5 MG/3ML) 0.083% IN NEBU: 2.5000 mg | INHALATION_SOLUTION | RESPIRATORY_TRACT | 2 refills | Status: DC | PRN

## 2017-09-02 NOTE — Telephone Encounter (Signed)
Medication filled to pharmacy as requested.   

## 2017-11-05 ENCOUNTER — Other Ambulatory Visit: Payer: Self-pay | Admitting: Family Medicine

## 2018-01-04 ENCOUNTER — Other Ambulatory Visit: Payer: Self-pay | Admitting: Family Medicine

## 2018-02-01 ENCOUNTER — Other Ambulatory Visit: Payer: Self-pay | Admitting: Family Medicine

## 2018-02-16 ENCOUNTER — Other Ambulatory Visit: Payer: Self-pay | Admitting: Family Medicine

## 2018-02-16 MED ORDER — DULAGLUTIDE 0.75 MG/0.5ML ~~LOC~~ SOAJ: SUBCUTANEOUS | 0 refills | Status: DC

## 2018-02-16 NOTE — Telephone Encounter (Signed)
Call to patient- left message to schedule appointment. Courtesy 1 month refill given. Requested Prescriptions  Pending Prescriptions Disp Refills  . Dulaglutide (TRULICITY) 9.73 ZH/2.9JM SOPN 2 mL 0    Sig: INJECT 0.5 mls (1 syringe) into SKIN ONCE WEEKLY.     Endocrinology:  Diabetes - GLP-1 Receptor Agonists Failed - 02/16/2018 11:19 AM      Failed - HBA1C is between 0 and 7.9 and within 180 days    Hgb A1c MFr Bld  Date Value Ref Range Status  07/19/2017 7.0 (H) 4.6 - 6.5 % Final    Comment:    Glycemic Control Guidelines for People with Diabetes:Non Diabetic:  <6%Goal of Therapy: <7%Additional Action Suggested:  >8%          Failed - Valid encounter within last 6 months    Recent Outpatient Visits          7 months ago Physical exam   [place] at [facility], Alinda Sierras, MD   11 months ago Bloody diarrhea   [facility], Alinda Sierras, MD   1 year ago [place] at [facility], Alinda Sierras, MD   1 year ago [place] at [facility], Alinda Sierras, MD   1 year ago Gout, unspecified cause, unspecified chronicity, unspecified site   [place] at [facility], Alinda Sierras, MD

## 2018-02-16 NOTE — Telephone Encounter (Signed)
Copied from [place] [id number]. Topic: Quick Communication - Rx Refill/Question >> Feb 16, 2018 11:11 AM Keene Breath wrote: Medication: Dulaglutide (TRULICITY) 5.89 UQ/3.4FH SOPN  Patient called to request a refill for the above medication  Preferred Pharmacy (with phone number or street name): [place], [place] - [address] [phone] (Phone) [phone] (Fax)

## 2018-02-27 ENCOUNTER — Ambulatory Visit: Payer: Managed Care, Other (non HMO) | Admitting: Family Medicine

## 2018-02-27 ENCOUNTER — Other Ambulatory Visit: Payer: Self-pay

## 2018-02-27 ENCOUNTER — Encounter: Payer: Self-pay | Admitting: Family Medicine

## 2018-02-27 VITALS — BP 120/68 | HR 104 | Temp 97.9°F | Ht 66.0 in | Wt 219.6 lb

## 2018-02-27 DIAGNOSIS — I10 Essential (primary) hypertension: Secondary | ICD-10-CM

## 2018-02-27 DIAGNOSIS — L570 Actinic keratosis: Secondary | ICD-10-CM | POA: Diagnosis not present

## 2018-02-27 DIAGNOSIS — E7800 Pure hypercholesterolemia, unspecified: Secondary | ICD-10-CM | POA: Diagnosis not present

## 2018-02-27 DIAGNOSIS — E119 Type 2 diabetes mellitus without complications: Secondary | ICD-10-CM

## 2018-02-27 LAB — POCT GLYCOSYLATED HEMOGLOBIN (HGB A1C): Hemoglobin A1C: 6.6 % — AB (ref 4.0–5.6)

## 2018-02-27 MED ORDER — DULAGLUTIDE 0.75 MG/0.5ML ~~LOC~~ SOAJ: SUBCUTANEOUS | 11 refills | Status: DC

## 2018-02-27 MED ORDER — ONDANSETRON HCL 8 MG PO TABS: 8.0000 mg | ORAL_TABLET | Freq: Three times a day (TID) | ORAL | 1 refills | Status: DC | PRN

## 2018-02-27 NOTE — Progress Notes (Signed)
Subjective:     Patient ID: Sherry Gallagher, female   DOB: 04-03-1955, 63 y.o.   MRN: [id number]  HPI Patient is seen for medical follow-up.  Her chronic problems include history of asthma, type 2 diabetes, hyperlipidemia, gout.  No recent gout flareups.  Her blood pressures have been well controlled.  She has been somewhat poorly compliant with exercise until recently and she just resumed swimming in January earlier this month.  Not monitoring blood sugars regularly.  Last A1c was 7.0% and this was over 6 months ago.  She remains on metformin and Trulicity.  Her weight is unchanged  She is requesting refill of Zofran.  She does not have any nausea currently but likes to have some on hand for any future issues with nausea.  She has noted slightly scaly area left temporal region recently.  No ulceration.  No past history of skin cancer.  She has hyperlipidemia which is treated with pravastatin.  No myalgias.  Lipids were checked last summer  Past Medical History:  Diagnosis Date  . Allergy   . Asthma    seasonal - uses inhalers daily during fall  . Diabetes mellitus    fasting 130s  . GERD (gastroesophageal reflux disease)   . Pneumonia    2 yrs   Past Surgical History:  Procedure Laterality Date  . ABDOMINAL HYSTERECTOMY  99  . AUGMENTATION MAMMAPLASTY Bilateral    30 years ago   . BREAST BIOPSY Left 11/10/2012   Procedure: BREAST BIOPSY WITH NEEDLE LOCALIZATION;  Surgeon: Rolm Bookbinder, MD;  Location: [facility];  Service: General;  Laterality: Left;  . BREAST SURGERY Bilateral    breast implants  . COLONOSCOPY    . NASAL SINUS SURGERY  1980    reports that she has never smoked. She has never used smokeless tobacco. She reports that she does not drink alcohol or use drugs. family history includes Alcohol abuse in her father; Cancer in her maternal grandmother and paternal grandmother. Allergies  Allergen Reactions  . Penicillins Hives and Swelling     Review of Systems   Constitutional: Negative for fatigue and unexpected weight change.  Eyes: Negative for visual disturbance.  Respiratory: Negative for cough, chest tightness, shortness of breath and wheezing.   Cardiovascular: Negative for chest pain, palpitations and leg swelling.  Endocrine: Negative for polydipsia and polyuria.  Neurological: Negative for dizziness, seizures, syncope, weakness, light-headedness and headaches.       Objective:   Physical Exam Constitutional:      Appearance: Normal appearance.  Neck:     Musculoskeletal: Neck supple.  Cardiovascular:     Rate and Rhythm: Normal rate and regular rhythm.  Pulmonary:     Effort: Pulmonary effort is normal.     Breath sounds: Normal breath sounds.  Skin:    Comments: She has small area left temporal region which is very minimally raised and rough feeling by palpation is slightly scaly.  No ulceration.  Dimensions are about 1/2 x 1/2 cm.  No pigmentation  Neurological:     Mental Status: She is alert.        Assessment:     #1 type 2 diabetes slightly improved with A1c 6.6%  #2 hypertension stable and at goal  #3 probable early actinic keratosis left side of face  #4 dyslipidemia treated with pravastatin    Plan:     -Refilled her Trulicity for 1 year -Get back to regular exercise habits.  We will plan routine follow-up in  6 months and recheck labs at that point including A1c -Refilled ondansetron for as needed use -We discussed actinic keratoses.  No urgency for treatment but would consider treat with liquid nitrogen at her follow-up  [name] MD [place] [facility] at [facility]

## 2018-03-01 ENCOUNTER — Other Ambulatory Visit: Payer: Self-pay | Admitting: Family Medicine

## 2018-05-15 ENCOUNTER — Other Ambulatory Visit: Payer: Self-pay | Admitting: Family Medicine

## 2018-05-31 ENCOUNTER — Other Ambulatory Visit: Payer: Self-pay | Admitting: Family Medicine

## 2018-07-20 ENCOUNTER — Telehealth: Payer: Self-pay | Admitting: Family Medicine

## 2018-07-20 DIAGNOSIS — J4540 Moderate persistent asthma, uncomplicated: Secondary | ICD-10-CM

## 2018-07-20 NOTE — Telephone Encounter (Signed)
Copied from [place] [id number]. Topic: Quick Communication - Rx Refill/Question >> Jul 20, 2018 10:00 AM Scherrie Gerlach wrote: Medication: Fluticasone-Salmeterol (ADVAIR DISKUS) 250-50 MCG/DOSE AEPB  Pt states she was one day past the expired date, because she doe not have to use all the time!! [place], [place] [phone] (Phone) [phone] (Fax)

## 2018-07-21 NOTE — Telephone Encounter (Signed)
Rx has been sent in. Left a detailed message on verified voice mail.

## 2018-07-29 ENCOUNTER — Other Ambulatory Visit: Payer: Self-pay | Admitting: Family Medicine

## 2018-08-05 ENCOUNTER — Other Ambulatory Visit: Payer: Self-pay | Admitting: Family Medicine

## 2018-09-17 ENCOUNTER — Other Ambulatory Visit: Payer: Self-pay | Admitting: Family Medicine

## 2018-09-17 ENCOUNTER — Other Ambulatory Visit: Payer: Self-pay | Admitting: *Deleted

## 2018-09-17 DIAGNOSIS — E119 Type 2 diabetes mellitus without complications: Secondary | ICD-10-CM

## 2018-09-17 MED ORDER — METFORMIN HCL 1000 MG PO TABS: ORAL_TABLET | ORAL | 0 refills | Status: DC

## 2018-09-17 NOTE — Telephone Encounter (Signed)
Megan please call patient to set up f/u and labs. Per Dr. Elease Hashimoto note in 02/2018 she was supposed to f/u last month for 6 month f/u.

## 2018-09-18 ENCOUNTER — Other Ambulatory Visit: Payer: Self-pay | Admitting: Family Medicine

## 2018-09-20 NOTE — Telephone Encounter (Signed)
Called patient and left a detailed voice message to let her know that she needs a follow up with labs for further refills. Left number to call back so she can schedule an appointment.  OK for PEC to discuss and/or advise.  CRM Created.

## 2018-09-27 ENCOUNTER — Encounter: Payer: Self-pay | Admitting: Family Medicine

## 2018-09-27 ENCOUNTER — Other Ambulatory Visit: Payer: Self-pay

## 2018-09-27 ENCOUNTER — Ambulatory Visit: Payer: Managed Care, Other (non HMO) | Admitting: Family Medicine

## 2018-09-27 VITALS — BP 126/72 | HR 86 | Temp 97.5°F | Ht 66.0 in | Wt 221.7 lb

## 2018-09-27 DIAGNOSIS — E7800 Pure hypercholesterolemia, unspecified: Secondary | ICD-10-CM | POA: Diagnosis not present

## 2018-09-27 DIAGNOSIS — I10 Essential (primary) hypertension: Secondary | ICD-10-CM

## 2018-09-27 DIAGNOSIS — E119 Type 2 diabetes mellitus without complications: Secondary | ICD-10-CM | POA: Diagnosis not present

## 2018-09-27 LAB — BASIC METABOLIC PANEL
BUN: 15 mg/dL (ref 6–23)
CO2: 27 mEq/L (ref 19–32)
Calcium: 9.8 mg/dL (ref 8.4–10.5)
Chloride: 103 mEq/L (ref 96–112)
Creatinine, Ser: 0.64 mg/dL (ref 0.40–1.20)
GFR: 93.68 mL/min (ref >60.00)
Glucose, Bld: 161 mg/dL — ABNORMAL HIGH (ref 70–99)
Potassium: 5.1 mEq/L (ref 3.5–5.1)
Sodium: 140 mEq/L (ref 135–145)

## 2018-09-27 LAB — LIPID PANEL
Cholesterol: 182 mg/dL (ref 0–200)
HDL: 52.4 mg/dL (ref >39.00)
LDL Cholesterol: 93 mg/dL (ref 0–99)
NonHDL: 129.62
Total CHOL/HDL Ratio: 3
Triglycerides: 185 mg/dL — ABNORMAL HIGH (ref 0.0–149.0)
VLDL: 37 mg/dL (ref 0.0–40.0)

## 2018-09-27 LAB — POCT GLYCOSYLATED HEMOGLOBIN (HGB A1C): Hemoglobin A1C: 7.2 % — AB (ref 4.0–5.6)

## 2018-09-27 LAB — HEPATIC FUNCTION PANEL
ALT: 17 U/L (ref 0–35)
AST: 15 U/L (ref 0–37)
Albumin: 4.3 g/dL (ref 3.5–5.2)
Alkaline Phosphatase: 83 U/L (ref 39–117)
Bilirubin, Direct: 0.1 mg/dL (ref 0.0–0.3)
Total Bilirubin: 0.5 mg/dL (ref 0.2–1.2)
Total Protein: 7.1 g/dL (ref 6.0–8.3)

## 2018-09-27 NOTE — Progress Notes (Signed)
Subjective:     Patient ID: Sherry Gallagher, female   DOB: [birth year], 63 y.o.   MRN: [id number]  HPI Patient here for medical follow-up.  Her chronic problems include type 2 diabetes, hyperlipidemia, hypertension, obesity.  Her exercise has been altered.  She has done a lot of swimming in the past and has had to resort to walking.  She hopes to resume swimming soon.  She has been eating out less but she thinks eating more calories at home.  Weight is only minimally changed from last January.  She is up about a pound.  She does had recent eye exam couple weeks ago with no concerns.  She has hyperlipidemia treated with pravastatin.  No myalgias.  She has hypertension which is treated with benazepril HCTZ.  No dizziness.  No chest pains.  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  Just started back this week.  Non-smoker.  Past Medical History:  Diagnosis Date  . Allergy   . Asthma    seasonal - uses inhalers daily during fall  . Diabetes mellitus    fasting 130s  . GERD (gastroesophageal reflux disease)   . Pneumonia    2 yrs   Past Surgical History:  Procedure Laterality Date  . ABDOMINAL HYSTERECTOMY  99  . AUGMENTATION MAMMAPLASTY Bilateral    30 years ago   . BREAST BIOPSY Left 11/10/2012   Procedure: BREAST BIOPSY WITH NEEDLE LOCALIZATION;  Surgeon: Rolm Bookbinder, MD;  Location: [facility];  Service: General;  Laterality: Left;  . BREAST SURGERY Bilateral    breast implants  . COLONOSCOPY    . NASAL SINUS SURGERY  1980    reports that she has never smoked. She has never used smokeless tobacco. She reports that she does not drink alcohol or use drugs. family history includes Alcohol abuse in her father; Cancer in her maternal grandmother and paternal grandmother. Allergies  Allergen Reactions  . Penicillins Hives and Swelling   Wt Readings from Last 3 Encounters:  09/27/18 221 lb 11.2 oz (100.6 kg)  02/27/18 219 lb 9.6 oz (99.6 kg)  07/19/17 219 lb 3.2 oz  (99.4 kg)     Review of Systems  Constitutional: Negative for fatigue.  Eyes: Negative for visual disturbance.  Respiratory: Negative for cough, chest tightness, shortness of breath and wheezing.   Cardiovascular: Negative for chest pain, palpitations and leg swelling.  Neurological: Negative for dizziness, seizures, syncope, weakness, light-headedness and headaches.       Objective:   Physical Exam Constitutional:      Appearance: She is well-developed.  Eyes:     Pupils: Pupils are equal, round, and reactive to light.  Neck:     Musculoskeletal: Neck supple.     Thyroid: No thyromegaly.     Vascular: No JVD.  Cardiovascular:     Rate and Rhythm: Normal rate and regular rhythm.     Heart sounds: No gallop.   Pulmonary:     Effort: Pulmonary effort is normal. No respiratory distress.     Breath sounds: Normal breath sounds. No wheezing or rales.  Skin:    Comments: Feet reveal no calluses.  She has fairly high arches.  No lesions.  Normal sensory function.  Neurological:     Mental Status: She is alert.        Assessment:     #1 type 2 diabetes.  A1c today 7.2% which is up from 6.6% last check  #2 hyperlipidemia treated with pravastatin  #3 hypertension  well-controlled    Plan:     -Discussed diabetes management options.  She already takes metformin and Trulicity.  We discussed other potential options such as SGLT2 medication but she would like to give this 3 months of tightening up diet and exercise and recheck first  -Recheck lipids and hepatic panel today along with basic metabolic panel  -Recommend flu vaccine this fall  [name] MD [place] [facility] at [facility]

## 2018-09-28 ENCOUNTER — Other Ambulatory Visit: Payer: Self-pay | Admitting: Family Medicine

## 2018-09-28 DIAGNOSIS — E119 Type 2 diabetes mellitus without complications: Secondary | ICD-10-CM

## 2018-10-10 ENCOUNTER — Other Ambulatory Visit: Payer: Self-pay | Admitting: Family Medicine

## 2018-10-16 ENCOUNTER — Other Ambulatory Visit: Payer: Self-pay | Admitting: Family Medicine

## 2018-10-16 DIAGNOSIS — E119 Type 2 diabetes mellitus without complications: Secondary | ICD-10-CM

## 2018-11-19 ENCOUNTER — Other Ambulatory Visit: Payer: Self-pay | Admitting: Family Medicine

## 2018-11-19 DIAGNOSIS — J4540 Moderate persistent asthma, uncomplicated: Secondary | ICD-10-CM

## 2018-11-19 DIAGNOSIS — E119 Type 2 diabetes mellitus without complications: Secondary | ICD-10-CM

## 2018-12-12 ENCOUNTER — Ambulatory Visit (INDEPENDENT_AMBULATORY_CARE_PROVIDER_SITE_OTHER): Payer: 59 | Admitting: Psychiatry

## 2018-12-12 ENCOUNTER — Other Ambulatory Visit: Payer: Self-pay

## 2018-12-12 DIAGNOSIS — F4323 Adjustment disorder with mixed anxiety and depressed mood: Secondary | ICD-10-CM | POA: Insufficient documentation

## 2018-12-12 NOTE — Progress Notes (Signed)
[facility] Counselor Initial Adult Exam  Name: Sherry Gallagher Date: 12/12/2018 MRN: [id number] DOB: 1955/10/09 PCP: [name], MD  Time spent: 60 minutes 12:00noon to 1:00pm  Guardian/Payee:  patient  Paperwork requested:  No   Reason for Visit /Presenting Problem: overwhelmed, tired, some depression and some anxiety; issues with husband's health conditions, issues with aging  Mental Status Exam:   Appearance:   Neat     Behavior:  Appropriate and Sharing  Motor:  Normal  Speech/Language:   Normal Rate  Affect:  anxious  Mood:  anxious and depressed  Thought process:  goal directed  Thought content:    WNL  Sensory/Perceptual disturbances:    WNL  Orientation:  oriented to person, place, time/date, situation, day of week, month of year and year  Attention:  Good  Concentration:  Good  Memory:  Pt reports some forgetting but not excessive  Fund of knowledge:   Good  Insight:    Good  Judgment:   Good  Impulse Control:  Good   Reported Symptoms:  See symptoms noted above  Risk Assessment: Danger to Self:  No Self-injurious Behavior: No Danger to Others: No Duty to Warn:no Physical Aggression / Violence:No  Access to Firearms a concern: No  Gang Involvement:No  Patient / guardian was educated about steps to take if suicide or homicide risk level increases between visits: no While future psychiatric events cannot be accurately predicted, the patient does not currently require acute inpatient psychiatric care and does not currently meet [facility] involuntary commitment criteria.  Substance Abuse History: Current substance abuse: No     Past Psychiatric History:   No previous psychological problems have been observed Outpatient Providers: none History of Psych Hospitalization: No  Psychological Testing: n/a   Abuse History: Victim of No., n/a   Report needed: No. Victim of Neglect:No. Perpetrator of n/a  Witness / Exposure to Domestic  Violence: No   Protective Services Involvement: No  Witness to [employer] Violence:  No   Family History:  Family History  Problem Relation Age of Onset  . Alcohol abuse Father   . Cancer Maternal Grandmother        breast  . Cancer Paternal Grandmother        breast    Living situation: the patient lives with their spouse  Sexual Orientation:  Straight  Relationship Status: married 30 years Name of spouse / other: name not given             If a parent, number of children / ages: 2 adult step-children, live locally and she reports being close to them  Support Systems; friends  Financial Stress:  No   Income/Employment/Disability: Employment  Armed forces logistics/support/administrative officer: No   Educational History: Education: Scientist, product/process development:   [employer]  Any cultural differences that may affect / interfere with treatment:  not applicable   Recreation/Hobbies: don't really have hobbies now, "I'm too busy, but I do like gardening  Stressors:Health problems Marital or family conflict; [id number]  Strengths:  Supportive Relationships, Friends, Spirituality, Hopefulness and Able to Communicate Effectively  Barriers:  husband's health (esp. Foot problems), lack of time to do other things, I am a perfectionist  Legal History: Pending legal issue / charges: none reported. History of legal issue / charges: none reported  Medical History/Surgical History:Reviewed with patient and she says info is correct except she has not had pneumonia. Past Medical History:  Diagnosis Date  . Allergy   . Asthma  seasonal - uses inhalers daily during fall  . Diabetes mellitus    fasting 130s  . GERD (gastroesophageal reflux disease)   . Pneumonia    2 yrs    Past Surgical History:  Procedure Laterality Date  . ABDOMINAL HYSTERECTOMY  99  . AUGMENTATION MAMMAPLASTY Bilateral    30 years ago   . BREAST BIOPSY Left 11/10/2012   Procedure: BREAST BIOPSY WITH NEEDLE  LOCALIZATION;  Surgeon: Rolm Bookbinder, MD;  Location: [place];  Service: General;  Laterality: Left;  . BREAST SURGERY Bilateral    breast implants  . COLONOSCOPY    . NASAL SINUS SURGERY  1980    Medications:  Patient reports this info is correct. Current Outpatient Medications  Medication Sig Dispense Refill  . albuterol (PROVENTIL HFA) 108 (90 Base) MCG/ACT inhaler Inhale 2 puffs into the lungs every 4 hours as needed for wheezing or shortness of breath. 6.7 g 5  . albuterol (PROVENTIL) (2.5 MG/3ML) 0.083% nebulizer solution Take 3 mLs (2.5 mg total) by nebulization every 4 (four) hours as needed for wheezing or shortness of breath. 75 mL 2  . allopurinol (ZYLOPRIM) 100 MG tablet Take 1 tablet (100 mg total) by mouth daily. 30 tablet 6  . aspirin EC 81 MG tablet Take 81 mg by mouth every morning.    . benazepril-hydrochlorthiazide (LOTENSIN HCT) 20-25 MG tablet TAKE 1/2 TABLET BY MOUTH DAILY 45 tablet 2  . cetirizine (ZYRTEC) 10 MG tablet Take 10 mg by mouth every other day. Every other day    . colchicine 0.6 MG tablet TAKE 2 TABLETS BY MOUTH TODAY, THEN 1 TABLET 2 TIMES DAILY 180 tablet 1  . COMFORT LANCETS MISC Use as directed.  Ultra Soft Touch Lancets 100 each 11  . Dulaglutide (TRULICITY) A999333 0000000 SOPN INJECT 0.5 mls (1 syringe) into SKIN ONCE WEEKLY. 2 mL 11  . esomeprazole (NEXIUM) 40 MG capsule Take 40 mg by mouth every other day.    . fluticasone (FLONASE) 50 MCG/ACT nasal spray Place 2 sprays into the nose as needed.     . Fluticasone-Salmeterol (ADVAIR) 250-50 MCG/DOSE AEPB INHALE 1 PUFF INTO THE LUNGS DAILY AS NEEDED 60 each 0  . glucose blood (ONE TOUCH ULTRA TEST) test strip Use to check blood sugar every day. DX: E11.9 100 each 2  . metFORMIN (GLUCOPHAGE) 1000 MG tablet TAKE ONE TABLET BY MOUTH EVERY MORNING AND TAKE ONE TABLET BY MOUTH EVERY EVENING 60 tablet 0  . Multiple Vitamin (MULTIVITAMIN) tablet Take 1 tablet by mouth daily.    . ondansetron (ZOFRAN) 8 MG  tablet Take 1 tablet (8 mg total) by mouth every 8 (eight) hours as needed for nausea. 10 tablet 1  . pravastatin (PRAVACHOL) 40 MG tablet TAKE ONE TABLET BY MOUTH DAILY 38 tablet 0  . VITAMIN D, CHOLECALCIFEROL, PO Take 1 tablet by mouth every evening.     No current facility-administered medications for this visit.     Allergies  Allergen Reactions  . Penicillins Hives and Swelling    Diagnoses:    ICD-10-CM   1. Adjustment disorder with mixed anxiety and depressed mood  F43.23     Subjective:   Patient is a 63 yr old, married for 30 yrs, caucasian. Has 2 adult step-sons that she is close to. Works as Location manager at [employer]. Concerns include some overwhelmedness, depression, anxiety, and frustration in dealing with husband's health issues, aging mother, job stressors.  Feels like husband drains her emotionally, not abusive  but can "be a bully." Patient  Admits "I am a great Enabler."   Plan of Care:  Patient not signing tx plan on the computer screen due to COVID-19.  Treatment goals: Goals remain on tx plan as patient works on strategies to achieve her goals.  Progress is noted each session in the "Progress" section of Plan.  Long term goals: Reduce overall level, frequency, and intensity of anxiety so that daily functioning is not impaired.  Short term goals: Increase understanding of beliefs and messages that produce worry and anxiety.  Strategy: Identify, challenge, and replace fearful and anxious self-talk with positive, realistic, and empowering self-talk.   Progress: This is patient's initial visit today and we worked together on formulating her treatment goal plan.  She asked some good questions and received clarification as well as examples.  She is to go ahead and start self-monitoring her thoughts, especially during more anxious or depressive times, keeping notes to share at next appt when we move further with more strategies. Seems motivated for  change.    Next appt within 2-3 weeks.   Shanon Ace, LCSW

## 2018-12-19 ENCOUNTER — Other Ambulatory Visit: Payer: Self-pay | Admitting: Family Medicine

## 2018-12-19 DIAGNOSIS — J4540 Moderate persistent asthma, uncomplicated: Secondary | ICD-10-CM

## 2019-01-08 ENCOUNTER — Other Ambulatory Visit: Payer: Self-pay

## 2019-01-08 ENCOUNTER — Ambulatory Visit (INDEPENDENT_AMBULATORY_CARE_PROVIDER_SITE_OTHER): Payer: 59 | Admitting: Psychiatry

## 2019-01-08 DIAGNOSIS — F4323 Adjustment disorder with mixed anxiety and depressed mood: Secondary | ICD-10-CM | POA: Diagnosis not present

## 2019-01-08 NOTE — Progress Notes (Signed)
      [facility] Counselor/Therapist Progress Note  Patient ID: Sherry Gallagher, MRN: [id number],    Date: 01/08/2019  Time Spent: 60 minutes  12:05pm to 1:05pm   Treatment Type: Individual Therapy  Reported Symptoms: anxiety, some depression, stressed re: situation with husband  Mental Status Exam:  Appearance:   Well Groomed     Behavior:  Appropriate and Sharing  Motor:  Normal  Speech/Language:   Clear and Coherent  Affect:  anxious, some depression  Mood:  anxious and depressed  Thought process:  goal directed  Thought content:    WNL  Sensory/Perceptual disturbances:    WNL  Orientation:  oriented to person, place, time/date, situation, day of week, month of year and year  Attention:  Good  Concentration:  Good  Memory:  WNL  Fund of knowledge:   Good  Insight:    Good  Judgment:   Good  Impulse Control:  Good   Risk Assessment: Danger to Self:  No Self-injurious Behavior: No Danger to Others: No Duty to Warn:no Physical Aggression / Violence:No  Access to Firearms a concern: No  Gang Involvement:No   Subjective:  Patient in today with increased stress primarily in her relationship with demanding and dependant husband, very dependant on patient.    Interventions: Cognitive Behavioral Therapy and Solution-Oriented/Positive Psychology  Diagnosis:   ICD-10-CM   1. Adjustment disorder with mixed anxiety and depressed mood  F43.23     Plan of Care:  Patient not signing tx plan on the computer screen due to COVID-19.  Treatment goals: Goals remain on tx plan as patient works on strategies to achieve her goals.  Progress is noted each session in the "Progress" section of Plan.  Long term goals: Reduce overall level, frequency, and intensity of anxiety so that daily functioning is not impaired.  Short term goals: Increase understanding of beliefs and messages that produce worry and anxiety.  Strategy: Identify, challenge, and replace fearful and  anxious self-talk with positive, realistic, and empowering self-talk.   Progress: Patient is making some progress on her goals. Working hard in session and between 1st session and today on trying to intercept her anxious thoughts and self-talk and replace both with more positive, reality-based, and empowering thought patterns that do not lead to anxiety nor depression. Also focused today on her needing to set much healthier boundaries within relationships and better communication in setting limits.  To work on this in between today and next appt and review at that point.  Remains motivated. Goal review and some progress noted, more in her belief in herself and her efforts.   Next appt within 2-3 weeks.   Shanon Ace, LCSW

## 2019-01-16 ENCOUNTER — Telehealth: Payer: Self-pay | Admitting: Family Medicine

## 2019-01-16 NOTE — Telephone Encounter (Signed)
Patient dropped off [facility] [employer] physical exam form.  Call the patient for pick up at: [phone]  Disposition: Dr's folder

## 2019-01-16 NOTE — Telephone Encounter (Signed)
Forms placed in red folder. Please return when complete.

## 2019-01-17 ENCOUNTER — Telehealth: Payer: Self-pay

## 2019-01-17 NOTE — Telephone Encounter (Signed)
Called patient and left a voice message to let her know that the form she dropped off yesterday is complete and ready for pick up a the front desk.  OK for [facility] to discuss and/or advise.  CRM Created.

## 2019-01-18 NOTE — Telephone Encounter (Signed)
Patient picked up form

## 2019-01-22 ENCOUNTER — Other Ambulatory Visit: Payer: Self-pay | Admitting: Family Medicine

## 2019-01-22 DIAGNOSIS — E119 Type 2 diabetes mellitus without complications: Secondary | ICD-10-CM

## 2019-02-05 ENCOUNTER — Other Ambulatory Visit: Payer: Self-pay | Admitting: Family Medicine

## 2019-02-09 HISTORY — PX: TOTAL HIP ARTHROPLASTY: SHX124

## 2019-02-21 ENCOUNTER — Other Ambulatory Visit: Payer: Self-pay | Admitting: Family Medicine

## 2019-02-21 DIAGNOSIS — E119 Type 2 diabetes mellitus without complications: Secondary | ICD-10-CM

## 2019-03-15 ENCOUNTER — Other Ambulatory Visit: Payer: Self-pay | Admitting: Family Medicine

## 2019-03-15 NOTE — Telephone Encounter (Signed)
Rx sent 

## 2019-03-15 NOTE — Telephone Encounter (Signed)
Pt is calling in for a refill TRULICITY [id number] MG  Pharm:  [facility].  Pt state that she is out took the last one this morning (03/15/2019).

## 2019-03-20 ENCOUNTER — Other Ambulatory Visit: Payer: Self-pay | Admitting: Family Medicine

## 2019-03-20 DIAGNOSIS — E119 Type 2 diabetes mellitus without complications: Secondary | ICD-10-CM

## 2019-04-10 ENCOUNTER — Other Ambulatory Visit: Payer: Self-pay | Admitting: *Deleted

## 2019-04-10 ENCOUNTER — Telehealth: Payer: Self-pay | Admitting: Family Medicine

## 2019-04-10 DIAGNOSIS — J4540 Moderate persistent asthma, uncomplicated: Secondary | ICD-10-CM

## 2019-04-10 MED ORDER — FLUTICASONE-SALMETEROL 250-50 MCG/DOSE IN AEPB: INHALATION_SPRAY | RESPIRATORY_TRACT | 1 refills | Status: DC

## 2019-04-10 NOTE — Telephone Encounter (Signed)
Surgical clearance appointment scheduled and Rx sent to the pharmacy.

## 2019-04-10 NOTE — Telephone Encounter (Signed)
Pt is requesting a refill on Fluticasone-Salmeterol(ADVAIR) 250-50 MCG pt uses [facility] on [address]  Pt is also requesting lab work for medical clearance. Pt is having hip replacement surgery on April 19 and needs labs. Thanks

## 2019-04-19 ENCOUNTER — Other Ambulatory Visit: Payer: Self-pay | Admitting: Family Medicine

## 2019-04-19 DIAGNOSIS — E119 Type 2 diabetes mellitus without complications: Secondary | ICD-10-CM

## 2019-04-23 ENCOUNTER — Other Ambulatory Visit: Payer: Self-pay

## 2019-04-24 ENCOUNTER — Ambulatory Visit (INDEPENDENT_AMBULATORY_CARE_PROVIDER_SITE_OTHER): Payer: Managed Care, Other (non HMO) | Admitting: Family Medicine

## 2019-04-24 ENCOUNTER — Encounter: Payer: Self-pay | Admitting: Family Medicine

## 2019-04-24 VITALS — BP 118/64 | HR 90 | Temp 97.6°F | Wt 213.7 lb

## 2019-04-24 DIAGNOSIS — Z01818 Encounter for other preprocedural examination: Secondary | ICD-10-CM

## 2019-04-24 LAB — COMPREHENSIVE METABOLIC PANEL
ALT: 20 U/L (ref 0–35)
AST: 20 U/L (ref 0–37)
Albumin: 4.4 g/dL (ref 3.5–5.2)
Alkaline Phosphatase: 71 U/L (ref 39–117)
BUN: 16 mg/dL (ref 6–23)
CO2: 30 mEq/L (ref 19–32)
Calcium: 10.7 mg/dL — ABNORMAL HIGH (ref 8.4–10.5)
Chloride: 102 mEq/L (ref 96–112)
Creatinine, Ser: 0.69 mg/dL (ref 0.40–1.20)
GFR: 85.73 mL/min (ref >60.00)
Glucose, Bld: 140 mg/dL — ABNORMAL HIGH (ref 70–99)
Potassium: 5.4 mEq/L — ABNORMAL HIGH (ref 3.5–5.1)
Sodium: 141 mEq/L (ref 135–145)
Total Bilirubin: 0.6 mg/dL (ref 0.2–1.2)
Total Protein: 7.4 g/dL (ref 6.0–8.3)

## 2019-04-24 LAB — POCT URINALYSIS DIPSTICK
Bilirubin, UA: NEGATIVE
Blood, UA: NEGATIVE
Glucose, UA: NEGATIVE
Ketones, UA: NEGATIVE
Leukocytes, UA: NEGATIVE
Nitrite, UA: NEGATIVE
Protein, UA: NEGATIVE
Spec Grav, UA: 1.02 (ref 1.010–1.025)
Urobilinogen, UA: 0.2 E.U./dL
pH, UA: 6 (ref 5.0–8.0)

## 2019-04-24 LAB — CBC WITH DIFFERENTIAL/PLATELET
Basophils Absolute: 0.1 10*3/uL (ref 0.0–0.1)
Basophils Relative: 1.3 % (ref 0.0–3.0)
Eosinophils Absolute: 0.3 10*3/uL (ref 0.0–0.7)
Eosinophils Relative: 4.3 % (ref 0.0–5.0)
HCT: 42.1 % (ref 36.0–46.0)
Hemoglobin: 14 g/dL (ref 12.0–15.0)
Lymphocytes Relative: 25.8 % (ref 12.0–46.0)
Lymphs Abs: 2 10*3/uL (ref 0.7–4.0)
MCHC: 33.3 g/dL (ref 30.0–36.0)
MCV: 92.9 fl (ref 78.0–100.0)
Monocytes Absolute: 0.5 10*3/uL (ref 0.1–1.0)
Monocytes Relative: 6.3 % (ref 3.0–12.0)
Neutro Abs: 4.8 10*3/uL (ref 1.4–7.7)
Neutrophils Relative %: 62.3 % (ref 43.0–77.0)
Platelets: 430 10*3/uL — ABNORMAL HIGH (ref 150.0–400.0)
RBC: 4.53 Mil/uL (ref 3.87–5.11)
RDW: 13.3 % (ref 11.5–15.5)
WBC: 7.7 10*3/uL (ref 4.0–10.5)

## 2019-04-24 LAB — HEMOGLOBIN A1C: Hgb A1c MFr Bld: 7.3 % — ABNORMAL HIGH (ref 4.6–6.5)

## 2019-04-24 LAB — PROTIME-INR
INR: 1 ratio (ref 0.8–1.0)
Prothrombin Time: 10.7 s (ref 9.6–13.1)

## 2019-04-24 NOTE — Patient Instructions (Signed)
Your EKG looks good.  No concerns  We will be in touch will labs done today  Remember to stop the aspirin at least 5 days prior to surgery.

## 2019-04-24 NOTE — Progress Notes (Signed)
Subjective:     Patient ID: Sherry Gallagher, female   DOB: November 16, 1955, 64 y.o.   MRN: [id number]  HPI   Sherry Gallagher is seen for preoperative clearance.  She has history of type 2 diabetes, hypertension, moderate persistent asthma, gout, hyperlipidemia.  She will be getting left total hip replacement April 19.  She does not have any history of known cardiovascular disease.  No recent chest pains.  No exertional dyspnea.  Her asthma has been fairly well controlled.  Her last A1c was 7.2%.  She has lost some weight since then.  She has not been able to swim but has been doing some cycling.  She is excited about getting the hip surgery done.  No history of smoking.  Past Medical History:  Diagnosis Date  . Allergy   . Asthma    seasonal - uses inhalers daily during fall  . Diabetes mellitus    fasting 130s  . GERD (gastroesophageal reflux disease)   . Pneumonia    2 yrs   Past Surgical History:  Procedure Laterality Date  . ABDOMINAL HYSTERECTOMY  99  . AUGMENTATION MAMMAPLASTY Bilateral    30 years ago   . BREAST BIOPSY Left 11/10/2012   Procedure: BREAST BIOPSY WITH NEEDLE LOCALIZATION;  Surgeon: Rolm Bookbinder, MD;  Location: [facility];  Service: General;  Laterality: Left;  . BREAST SURGERY Bilateral    breast implants  . COLONOSCOPY    . NASAL SINUS SURGERY  1980    reports that she has never smoked. She has never used smokeless tobacco. She reports that she does not drink alcohol or use drugs. family history includes Alcohol abuse in her father; Cancer in her maternal grandmother and paternal grandmother. Allergies  Allergen Reactions  . Penicillins Hives and Swelling   Wt Readings from Last 3 Encounters:  04/24/19 213 lb 11.2 oz (96.9 kg)  09/27/18 221 lb 11.2 oz (100.6 kg)  02/27/18 219 lb 9.6 oz (99.6 kg)     Review of Systems  Constitutional: Negative for chills, fatigue and fever.  Eyes: Negative for visual disturbance.  Respiratory: Negative for cough, chest  tightness, shortness of breath and wheezing.   Cardiovascular: Negative for chest pain, palpitations and leg swelling.  Neurological: Negative for dizziness, seizures, syncope, weakness, light-headedness and headaches.       Objective:   Physical Exam Vitals reviewed.  Constitutional:      Appearance: Normal appearance.  Cardiovascular:     Rate and Rhythm: Normal rate and regular rhythm.     Heart sounds: No murmur.  Pulmonary:     Effort: Pulmonary effort is normal.     Breath sounds: Normal breath sounds. No rales.  Musculoskeletal:     Cervical back: Neck supple.     Right lower leg: No edema.     Left lower leg: No edema.  Lymphadenopathy:     Cervical: No cervical adenopathy.  Neurological:     Mental Status: She is alert.        Assessment:     Preoperative clearance.  She does have type 2 diabetes which has been fairly well controlled.  Hopefully this will be further improved with her recent weight loss.      Plan:     -Obtain preoperative EKG -Obtain further labs with repeat A1c, CBC, comprehensive metabolic panel, INR, urinalysis -She is aware to hold her aspirin at least 5 days prior to surgery  [name] MD [facility]

## 2019-05-10 ENCOUNTER — Other Ambulatory Visit: Payer: Self-pay | Admitting: Family Medicine

## 2019-05-19 ENCOUNTER — Other Ambulatory Visit: Payer: Self-pay | Admitting: Family Medicine

## 2019-05-19 DIAGNOSIS — E119 Type 2 diabetes mellitus without complications: Secondary | ICD-10-CM

## 2019-06-18 ENCOUNTER — Other Ambulatory Visit: Payer: Self-pay | Admitting: Family Medicine

## 2019-07-05 ENCOUNTER — Other Ambulatory Visit: Payer: Self-pay | Admitting: Family Medicine

## 2019-07-31 ENCOUNTER — Other Ambulatory Visit: Payer: Self-pay | Admitting: Family Medicine

## 2019-08-29 ENCOUNTER — Other Ambulatory Visit: Payer: Self-pay | Admitting: Family Medicine

## 2019-09-04 ENCOUNTER — Other Ambulatory Visit: Payer: Self-pay

## 2019-09-04 ENCOUNTER — Encounter: Payer: Self-pay | Admitting: Family Medicine

## 2019-09-04 ENCOUNTER — Ambulatory Visit: Payer: Managed Care, Other (non HMO) | Admitting: Family Medicine

## 2019-09-04 ENCOUNTER — Ambulatory Visit (INDEPENDENT_AMBULATORY_CARE_PROVIDER_SITE_OTHER)
Admission: RE | Admit: 2019-09-04 | Discharge: 2019-09-04 | Disposition: A | Discharge status: Home / Self Care | Payer: Managed Care, Other (non HMO) | Source: Ambulatory Visit | Attending: Family Medicine | Admitting: Family Medicine

## 2019-09-04 VITALS — BP 110/70 | HR 81 | Temp 98.4°F | Ht 66.0 in | Wt 207.3 lb

## 2019-09-04 DIAGNOSIS — E119 Type 2 diabetes mellitus without complications: Secondary | ICD-10-CM

## 2019-09-04 DIAGNOSIS — Q759 Congenital malformation of skull and face bones, unspecified: Secondary | ICD-10-CM

## 2019-09-04 LAB — COMPREHENSIVE METABOLIC PANEL
AG Ratio: 1.7 (calc) (ref 1.0–2.5)
ALT: 17 U/L (ref 6–29)
AST: 20 U/L (ref 10–35)
Albumin: 4.4 g/dL (ref 3.6–5.1)
Alkaline phosphatase (APISO): 83 U/L (ref 37–153)
BUN: 20 mg/dL (ref 7–25)
CO2: 25 mmol/L (ref 20–32)
Calcium: 9.9 mg/dL (ref 8.6–10.4)
Chloride: 101 mmol/L (ref 98–110)
Creat: 0.82 mg/dL (ref 0.50–0.99)
Globulin: 2.6 g/dL (calc) (ref 1.9–3.7)
Glucose, Bld: 141 mg/dL — ABNORMAL HIGH (ref 65–99)
Potassium: 4.2 mmol/L (ref 3.5–5.3)
Sodium: 138 mmol/L (ref 135–146)
Total Bilirubin: 0.4 mg/dL (ref 0.2–1.2)
Total Protein: 7 g/dL (ref 6.1–8.1)

## 2019-09-04 LAB — SEDIMENTATION RATE: Sed Rate: 22 mm/h (ref 0–30)

## 2019-09-04 LAB — POCT GLYCOSYLATED HEMOGLOBIN (HGB A1C): Hemoglobin A1C: 6.6 % — AB (ref 4.0–5.6)

## 2019-09-04 NOTE — Patient Instructions (Signed)
Go for X-ray at [facility] ([place] building- basement).

## 2019-09-04 NOTE — Progress Notes (Signed)
Established Patient Office Visit  Subjective:  Patient ID: Sherry Gallagher, female    DOB: 03/17/1955  Age: 64 y.o. MRN: [id number]  CC:  Chief Complaint  Patient presents with  . A1C check  . Indention on top of head    no pain, no injury/fall, just popped up    HPI Sherry Gallagher presents for follow-up regarding type 2 diabetes and recently she noted an "indentation" mid parietal scalp.  This was noted incidentally.  She denies any injury.  No headache.  No recent confusion or cognitive changes.  She had recent left total hip replacement.  She has lost some weight since then.  Has been able to be somewhat more active though she has had challenge recently of taking care of her husband who had to have foot amputation secondary to complications of Charcot foot.  Sherry Gallagher had mildly elevated calcium of 10.7 on most recent labs but corrected calcium was normal range.  Past Medical History:  Diagnosis Date  . Allergy   . Asthma    seasonal - uses inhalers daily during fall  . Diabetes mellitus    fasting 130s  . GERD (gastroesophageal reflux disease)   . Pneumonia    2 yrs    Past Surgical History:  Procedure Laterality Date  . ABDOMINAL HYSTERECTOMY  99  . AUGMENTATION MAMMAPLASTY Bilateral    30 years ago   . BREAST BIOPSY Left 11/10/2012   Procedure: BREAST BIOPSY WITH NEEDLE LOCALIZATION;  Surgeon: Rolm Bookbinder, MD;  Location: [place];  Service: General;  Laterality: Left;  . BREAST SURGERY Bilateral    breast implants  . COLONOSCOPY    . NASAL SINUS SURGERY  1980    Family History  Problem Relation Age of Onset  . Alcohol abuse Father   . Cancer Maternal Grandmother        breast  . Cancer Paternal Grandmother        breast    Social History   Socioeconomic History  . Marital status: Married    Spouse name: Not on file  . Number of children: Not on file  . Years of education: Not on file  . Highest education level: Not on file    Occupational History  . Not on file  Tobacco Use  . Smoking status: Never Smoker  . Smokeless tobacco: Never Used  Vaping Use  . Vaping Use: Never used  Substance and Sexual Activity  . Alcohol use: No  . Drug use: No  . Sexual activity: Yes    Birth control/protection: None  Other Topics Concern  . Not on file  Social History Narrative  . Not on file   Social Determinants of Health   Financial Resource Strain:   . Difficulty of Paying Living Expenses:   Food Insecurity:   . Worried About Charity fundraiser in the Last Year:   . Arboriculturist in the Last Year:   Transportation Needs:   . Film/video editor (Medical):   Marland Kitchen Lack of Transportation (Non-Medical):   Physical Activity:   . Days of Exercise per Week:   . Minutes of Exercise per Session:   Stress:   . Feeling of Stress :   Social Connections:   . Frequency of Communication with Friends and Family:   . Frequency of Social Gatherings with Friends and Family:   . Attends Religious Services:   . Active Member of Clubs or Organizations:   . Attends Club or  Organization Meetings:   Marland Kitchen Marital Status:   Intimate Partner Violence:   . Fear of Current or Ex-Partner:   . Emotionally Abused:   Marland Kitchen Physically Abused:   . Sexually Abused:     Outpatient Medications Prior to Visit  Medication Sig Dispense Refill  . allopurinol (ZYLOPRIM) 100 MG tablet Take 1 tablet (100 mg total) by mouth daily. 30 tablet 6  . aspirin EC 81 MG tablet Take 81 mg by mouth every morning.    . benazepril-hydrochlorthiazide (LOTENSIN HCT) 20-25 MG tablet TAKE 1/2 TABLET BY MOUTH DAILY 45 tablet 1  . cetirizine (ZYRTEC) 10 MG tablet Take 10 mg by mouth every other day. Every other day    . colchicine 0.6 MG tablet TAKE 2 TABLETS BY MOUTH TODAY, THEN 1 TABLET 2 TIMES DAILY 180 tablet 1  . COMFORT LANCETS MISC Use as directed.  Ultra Soft Touch Lancets 100 each 11  . esomeprazole (NEXIUM) 40 MG capsule Take 40 mg by mouth every other  day.    . fluticasone (FLONASE) 50 MCG/ACT nasal spray Place 2 sprays into the nose as needed.     . Fluticasone-Salmeterol (ADVAIR) 250-50 MCG/DOSE AEPB INHALE ONE PUFF BY MOUTH DAILY AS NEEDED 60 each 1  . glucose blood (ONE TOUCH ULTRA TEST) test strip Use to check blood sugar every day. DX: E11.9 100 each 2  . metFORMIN (GLUCOPHAGE) 1000 MG tablet TAKE ONE TABLET BY MOUTH EVERY MORNING AND TAKE ONE TABLET BY MOUTH EVERY EVENING 60 tablet 3  . Multiple Vitamin (MULTIVITAMIN) tablet Take 1 tablet by mouth daily.    . ondansetron (ZOFRAN) 8 MG tablet Take 1 tablet (8 mg total) by mouth every 8 (eight) hours as needed for nausea. 10 tablet 1  . polyethylene glycol (MIRALAX / GLYCOLAX) 17 g packet Take 1 packet by mouth daily.    . pravastatin (PRAVACHOL) 40 MG tablet TAKE ONE TABLET BY MOUTH DAILY 90 tablet 3  . TRULICITY 8.31 DV/7.6HY SOPN INJECT 0.5 ML'S INTO THE SKIN ONCE A WEEK 2 pen 1  . VITAMIN D, CHOLECALCIFEROL, PO Take 1 tablet by mouth every evening.    Marland Kitchen albuterol (PROVENTIL HFA) 108 (90 Base) MCG/ACT inhaler Inhale 2 puffs into the lungs every 4 hours as needed for wheezing or shortness of breath. 6.7 g 5  . albuterol (PROVENTIL) (2.5 MG/3ML) 0.083% nebulizer solution Take 3 mLs (2.5 mg total) by nebulization every 4 (four) hours as needed for wheezing or shortness of breath. 75 mL 2   No facility-administered medications prior to visit.    Allergies  Allergen Reactions  . Penicillin G   . Penicillins Hives and Swelling    ROS Review of Systems  Constitutional: Negative for appetite change, chills, fatigue, fever and unexpected weight change.  Eyes: Negative for visual disturbance.  Respiratory: Negative for cough, chest tightness, shortness of breath and wheezing.   Cardiovascular: Negative for chest pain, palpitations and leg swelling.  Endocrine: Negative for polydipsia and polyuria.  Neurological: Negative for dizziness, seizures, syncope, weakness, light-headedness and  headaches.      Objective:    Physical Exam Constitutional:      Appearance: She is well-developed.  HENT:     Head:     Comments: She does have small indentation along the mid parietal aspect right at the midline.  This is nontender.  No overlying scalp changes.  No masses palpated. Eyes:     Pupils: Pupils are equal, round, and reactive to light.  Neck:  Thyroid: No thyromegaly.     Vascular: No JVD.  Cardiovascular:     Rate and Rhythm: Normal rate and regular rhythm.     Heart sounds: No gallop.   Pulmonary:     Effort: Pulmonary effort is normal. No respiratory distress.     Breath sounds: Normal breath sounds. No wheezing or rales.  Musculoskeletal:     Cervical back: Neck supple.  Neurological:     Mental Status: She is alert.     BP 110/70 (BP Location: Right Arm, Patient Position: Sitting, Cuff Size: Normal)   Pulse 81   Temp 98.4 F (36.9 C) (Oral)   Ht 5\' 6"  (1.676 m)   Wt (!) 207 lb 5 oz (94 kg)   SpO2 97%   BMI 33.46 kg/m  Wt Readings from Last 3 Encounters:  09/04/19 (!) 207 lb 5 oz (94 kg)  04/24/19 213 lb 11.2 oz (96.9 kg)  09/27/18 221 lb 11.2 oz (100.6 kg)     Health Maintenance Due  Topic Date Due  . COVID-19 Vaccine (1) Never done  . HIV Screening  Never done  . MAMMOGRAM  10/13/2018  . PAP SMEAR-Modifier  09/24/2019    There are no preventive care reminders to display for this patient.  Lab Results  Component Value Date   TSH 2.32 07/19/2017   Lab Results  Component Value Date   WBC 7.7 04/24/2019   HGB 14.0 04/24/2019   HCT 42.1 04/24/2019   MCV 92.9 04/24/2019   PLT 430.0 (H) 04/24/2019   Lab Results  Component Value Date   NA 141 04/24/2019   K 5.4 (H) 04/24/2019   CO2 30 04/24/2019   GLUCOSE 140 (H) 04/24/2019   BUN 16 04/24/2019   CREATININE 0.69 04/24/2019   BILITOT 0.6 04/24/2019   ALKPHOS 71 04/24/2019   AST 20 04/24/2019   ALT 20 04/24/2019   PROT 7.4 04/24/2019   ALBUMIN 4.4 04/24/2019   CALCIUM 10.7  (H) 04/24/2019   GFR 85.73 04/24/2019   Lab Results  Component Value Date   CHOL 182 09/27/2018   Lab Results  Component Value Date   HDL 52.40 09/27/2018   Lab Results  Component Value Date   LDLCALC 93 09/27/2018   Lab Results  Component Value Date   TRIG 185.0 (H) 09/27/2018   Lab Results  Component Value Date   CHOLHDL 3 09/27/2018   Lab Results  Component Value Date   HGBA1C 6.6 (A) 09/04/2019      Assessment & Plan:   Problem List Items Addressed This Visit      Unprioritized   Type 2 diabetes mellitus, controlled ([place]) - Primary   Relevant Orders   POC HgB A1c (Completed)    Other Visit Diagnoses    Skull anomaly       Relevant Orders   DG Skull Complete   CMP   Sedimentation rate    A1c improved at 6.6%.  Continue weight loss efforts.  Increase exercise as tolerated.  Recommend 4 to 2-month follow-up  We decided to start with some skull films to evaluate skull anomaly as above.  Suspect this is likely benign.  She does not have any other worrisome symptoms.  We will also check sed rate and comprehensive metabolic panel especially in view of recent mildly elevated calcium level  No orders of the defined types were placed in this encounter.   Follow-up: No follow-ups on file.    Carolann Littler, MD

## 2019-09-16 ENCOUNTER — Other Ambulatory Visit: Payer: Self-pay | Admitting: Family Medicine

## 2019-09-16 DIAGNOSIS — E119 Type 2 diabetes mellitus without complications: Secondary | ICD-10-CM

## 2019-09-20 ENCOUNTER — Encounter: Payer: Self-pay | Admitting: Family Medicine

## 2019-09-20 ENCOUNTER — Telehealth (INDEPENDENT_AMBULATORY_CARE_PROVIDER_SITE_OTHER): Payer: Managed Care, Other (non HMO) | Admitting: Family Medicine

## 2019-09-20 VITALS — Wt 205.0 lb

## 2019-09-20 DIAGNOSIS — R300 Dysuria: Secondary | ICD-10-CM | POA: Diagnosis not present

## 2019-09-20 MED ORDER — NITROFURANTOIN MONOHYD MACRO 100 MG PO CAPS: 100.0000 mg | ORAL_CAPSULE | Freq: Two times a day (BID) | ORAL | 0 refills | Status: DC

## 2019-09-20 NOTE — Progress Notes (Signed)
Virtual Visit via Video Note  I connected with Sherry Gallagher  on 09/20/19 at  1:20 PM EDT by a video enabled telemedicine application and verified that I am speaking with the correct person using two identifiers.  Location patient: home, [place] Location provider:work or home office Persons participating in the virtual visit: patient, provider  I discussed the limitations of evaluation and management by telemedicine and the availability of in person appointments. The patient expressed understanding and agreed to proceed.   HPI:  Acute visit for dysuria: -started 3-4 days ago -symptoms: frequency, urgency, dysuria, cloudy urine -denies fevers, flank or abd pain, nausea, vomiting, hematuria -has had UTIs in the past and this feels the same -allergic to penicillin, has not had a UTI in some time  ROS: See pertinent positives and negatives per HPI.  Past Medical History:  Diagnosis Date   Allergy    Asthma    seasonal - uses inhalers daily during fall   Diabetes mellitus    fasting 130s   GERD (gastroesophageal reflux disease)    Pneumonia    2 yrs    Past Surgical History:  Procedure Laterality Date   ABDOMINAL HYSTERECTOMY  99   AUGMENTATION MAMMAPLASTY Bilateral    30 years ago    BREAST BIOPSY Left 11/10/2012   Procedure: BREAST BIOPSY WITH NEEDLE LOCALIZATION;  Surgeon: Rolm Bookbinder, MD;  Location: [place];  Service: General;  Laterality: Left;   BREAST SURGERY Bilateral    breast implants   COLONOSCOPY     NASAL SINUS SURGERY  1980    Family History  Problem Relation Age of Onset   Alcohol abuse Father    Cancer Maternal Grandmother        breast   Cancer Paternal Grandmother        breast    SOCIAL HX: see hpi   Current Outpatient Medications:    allopurinol (ZYLOPRIM) 100 MG tablet, Take 1 tablet (100 mg total) by mouth daily., Disp: 30 tablet, Rfl: 6   aspirin EC 81 MG tablet, Take 81 mg by mouth every morning., Disp: , Rfl:     benazepril-hydrochlorthiazide (LOTENSIN HCT) 20-25 MG tablet, TAKE 1/2 TABLET BY MOUTH DAILY, Disp: 45 tablet, Rfl: 1   cetirizine (ZYRTEC) 10 MG tablet, Take 10 mg by mouth every other day. Every other day, Disp: , Rfl:    colchicine 0.6 MG tablet, TAKE 2 TABLETS BY MOUTH TODAY, THEN 1 TABLET 2 TIMES DAILY, Disp: 180 tablet, Rfl: 1   COMFORT LANCETS MISC, Use as directed.  Ultra Soft Touch Lancets, Disp: 100 each, Rfl: 11   esomeprazole (NEXIUM) 40 MG capsule, Take 40 mg by mouth every other day., Disp: , Rfl:    fluticasone (FLONASE) 50 MCG/ACT nasal spray, Place 2 sprays into the nose as needed. , Disp: , Rfl:    Fluticasone-Salmeterol (ADVAIR) 250-50 MCG/DOSE AEPB, INHALE ONE PUFF BY MOUTH DAILY AS NEEDED, Disp: 60 each, Rfl: 1   glucose blood (ONE TOUCH ULTRA TEST) test strip, Use to check blood sugar every day. DX: E11.9, Disp: 100 each, Rfl: 2   metFORMIN (GLUCOPHAGE) 1000 MG tablet, TAKE ONE TABLET BY MOUTH EVERY MORNING AND TAKE ONE TABLET BY MOUTH EVERY EVENING, Disp: 180 tablet, Rfl: 0   Multiple Vitamin (MULTIVITAMIN) tablet, Take 1 tablet by mouth daily., Disp: , Rfl:    ondansetron (ZOFRAN) 8 MG tablet, Take 1 tablet (8 mg total) by mouth every 8 (eight) hours as needed for nausea., Disp: 10 tablet, Rfl: 1  polyethylene glycol (MIRALAX / GLYCOLAX) 17 g packet, Take 1 packet by mouth daily., Disp: , Rfl:    pravastatin (PRAVACHOL) 40 MG tablet, TAKE ONE TABLET BY MOUTH DAILY, Disp: 90 tablet, Rfl: 3   TRULICITY 9.41 DE/0.8XK SOPN, INJECT 0.5 ML'S INTO THE SKIN ONCE A WEEK, Disp: 2 pen, Rfl: 1   VITAMIN D, CHOLECALCIFEROL, PO, Take 1 tablet by mouth every evening., Disp: , Rfl:   EXAM:  VITALS per patient if applicable:  GENERAL: alert, oriented, appears well and in no acute distress  HEENT: atraumatic, conjunttiva clear, no obvious abnormalities on inspection of external nose and ears  NECK: normal movements of the head and neck  LUNGS: on inspection no signs of  respiratory distress, breathing rate appears normal, no obvious gross SOB, gasping or wheezing  CV: no obvious cyanosis  MS: moves all visible extremities without noticeable abnormality  PSYCH/NEURO: pleasant and cooperative, no obvious depression or anxiety, speech and thought processing grossly intact  ASSESSMENT AND PLAN:  Discussed the following assessment and plan:  No diagnosis found.  -we discussed possible serious and likely etiologies, options for evaluation and workup, limitations of telemedicine visit vs in person visit, treatment, treatment risks and precautions. Pt prefers to treat via telemedicine empirically rather then risking or undertaking an in person visit at this moment. She prefers to try empiric treatment with macrobid 100mg  bid x 7 days. Rx sent. Patient agrees to seek prompt in person care if worsening, new symptoms arise, or if is not improving with treatment.   I discussed the assessment and treatment plan with the patient. The patient was provided an opportunity to ask questions and all were answered. The patient agreed with the plan and demonstrated an understanding of the instructions.   The patient was advised to call back or seek an in-person evaluation if the symptoms worsen or if the condition fails to improve as anticipated.   Lucretia Kern, DO

## 2019-09-21 ENCOUNTER — Encounter: Payer: Self-pay | Admitting: Family Medicine

## 2019-09-21 DIAGNOSIS — Q759 Congenital malformation of skull and face bones, unspecified: Secondary | ICD-10-CM

## 2019-10-05 ENCOUNTER — Encounter: Payer: Self-pay | Admitting: Family Medicine

## 2019-10-05 DIAGNOSIS — Q759 Congenital malformation of skull and face bones, unspecified: Secondary | ICD-10-CM

## 2019-10-06 NOTE — Telephone Encounter (Signed)
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  Referral to neurosurgery placed.  Pt needing additional assurance

## 2019-10-18 ENCOUNTER — Other Ambulatory Visit: Payer: Self-pay | Admitting: Family Medicine

## 2019-10-19 NOTE — Telephone Encounter (Signed)
Pt is going out of town and really needs her medication. She is asking that she gets it sent to the pharmacy so that they can find a location that has it.  She will be out of town for a week and needs it.  Please advise

## 2019-10-19 NOTE — Telephone Encounter (Signed)
Pt is calling in stating that she is out of her Rx TRULICITY 3.83 KF/8.4CR   Pharm:  [facility]  [facility]

## 2019-11-07 ENCOUNTER — Other Ambulatory Visit: Payer: Self-pay | Admitting: Obstetrics & Gynecology

## 2019-11-07 DIAGNOSIS — R928 Other abnormal and inconclusive findings on diagnostic imaging of breast: Secondary | ICD-10-CM

## 2019-11-12 ENCOUNTER — Ambulatory Visit
Admission: RE | Admit: 2019-11-12 | Discharge: 2019-11-12 | Disposition: A | Discharge status: Home / Self Care | Payer: Managed Care, Other (non HMO) | Source: Ambulatory Visit | Attending: Obstetrics & Gynecology | Admitting: Obstetrics & Gynecology

## 2019-11-12 ENCOUNTER — Other Ambulatory Visit: Payer: Self-pay | Admitting: Obstetrics & Gynecology

## 2019-11-12 ENCOUNTER — Other Ambulatory Visit: Payer: Self-pay | Admitting: Family Medicine

## 2019-11-12 ENCOUNTER — Other Ambulatory Visit: Payer: Self-pay

## 2019-11-12 DIAGNOSIS — R921 Mammographic calcification found on diagnostic imaging of breast: Secondary | ICD-10-CM

## 2019-11-12 DIAGNOSIS — R928 Other abnormal and inconclusive findings on diagnostic imaging of breast: Secondary | ICD-10-CM

## 2019-11-20 ENCOUNTER — Ambulatory Visit
Admission: RE | Admit: 2019-11-20 | Discharge: 2019-11-20 | Disposition: A | Discharge status: Home / Self Care | Payer: Managed Care, Other (non HMO) | Source: Ambulatory Visit | Attending: Obstetrics & Gynecology | Admitting: Obstetrics & Gynecology

## 2019-11-20 ENCOUNTER — Other Ambulatory Visit: Payer: Self-pay

## 2019-11-20 DIAGNOSIS — R921 Mammographic calcification found on diagnostic imaging of breast: Secondary | ICD-10-CM

## 2019-11-27 ENCOUNTER — Other Ambulatory Visit: Payer: Self-pay | Admitting: Neurosurgery

## 2019-11-27 DIAGNOSIS — M899 Disorder of bone, unspecified: Secondary | ICD-10-CM

## 2019-12-11 ENCOUNTER — Other Ambulatory Visit (HOSPITAL_BASED_OUTPATIENT_CLINIC_OR_DEPARTMENT_OTHER): Payer: Self-pay | Admitting: Internal Medicine

## 2019-12-11 ENCOUNTER — Ambulatory Visit
Admission: RE | Admit: 2019-12-11 | Discharge: 2019-12-11 | Disposition: A | Discharge status: Home / Self Care | Payer: Managed Care, Other (non HMO) | Source: Ambulatory Visit | Attending: Neurosurgery | Admitting: Neurosurgery

## 2019-12-11 ENCOUNTER — Other Ambulatory Visit: Payer: Self-pay

## 2019-12-11 ENCOUNTER — Ambulatory Visit: Payer: Managed Care, Other (non HMO) | Attending: Internal Medicine

## 2019-12-11 DIAGNOSIS — M899 Disorder of bone, unspecified: Secondary | ICD-10-CM

## 2019-12-11 DIAGNOSIS — Z23 Encounter for immunization: Secondary | ICD-10-CM

## 2019-12-11 NOTE — Progress Notes (Signed)
   Covid-19 Vaccination Clinic  Name:  Sherry Gallagher    MRN: [id number] DOB: 07-27-1955  12/11/2019  Sherry Gallagher was observed post Covid-19 immunization for 15 minutes without incident. She was provided with Vaccine Information Sheet and instruction to access the V-Safe system.   Sherry Gallagher was instructed to call 911 with any severe reactions post vaccine: Marland Kitchen Difficulty breathing  . Swelling of face and throat  . A fast heartbeat  . A bad rash all over body  . Dizziness and weakness

## 2019-12-15 ENCOUNTER — Other Ambulatory Visit: Payer: Self-pay | Admitting: Family Medicine

## 2019-12-15 DIAGNOSIS — E119 Type 2 diabetes mellitus without complications: Secondary | ICD-10-CM

## 2019-12-20 MED FILL — PFIZER-BIONTECH COVID-19 VA: 30 | 1 days supply | Qty: 0 | Fill #0

## 2019-12-21 ENCOUNTER — Other Ambulatory Visit: Payer: Self-pay | Admitting: General Surgery

## 2019-12-21 DIAGNOSIS — N6081 Other benign mammary dysplasias of right breast: Secondary | ICD-10-CM

## 2019-12-27 ENCOUNTER — Other Ambulatory Visit: Payer: Self-pay | Admitting: General Surgery

## 2019-12-27 DIAGNOSIS — N6081 Other benign mammary dysplasias of right breast: Secondary | ICD-10-CM

## 2019-12-31 ENCOUNTER — Telehealth (INDEPENDENT_AMBULATORY_CARE_PROVIDER_SITE_OTHER): Payer: Managed Care, Other (non HMO) | Admitting: Family Medicine

## 2019-12-31 ENCOUNTER — Encounter: Payer: Self-pay | Admitting: Family Medicine

## 2019-12-31 VITALS — Ht 66.0 in | Wt 205.0 lb

## 2019-12-31 DIAGNOSIS — E119 Type 2 diabetes mellitus without complications: Secondary | ICD-10-CM | POA: Diagnosis not present

## 2019-12-31 NOTE — Progress Notes (Signed)
Patient ID: Sherry Gallagher, female   DOB: 14-Sep-1955, 64 y.o.   MRN: [id number]  This visit type was conducted due to national recommendations for restrictions regarding the COVID-19 pandemic in an effort to limit this patient's exposure and mitigate transmission in our community.   Virtual Visit via Telephone Note  I connected with Sherry Gallagher on 12/31/19 at 10:45 AM EST by telephone and verified that I am speaking with the correct person using two identifiers.   I discussed the limitations, risks, security and privacy concerns of performing an evaluation and management service by telephone and the availability of in person appointments. I also discussed with the patient that there may be a patient responsible charge related to this service. The patient expressed understanding and agreed to proceed.  Location patient: home Location provider: work or home office Participants present for the call: patient, provider Patient did not have a visit in the prior 7 days to address this/these issue(s).   History of Present Illness:  Sherry Gallagher called to discuss medications.  She has type 2 diabetes, hypertension, dyslipidemia, gout, obesity.  She recently got a letter from her insurance that as of January 1 her Trulicity would not be covered.  She has finally gotten good control of her diabetes with the addition of Trulicity to Metformin.  This is also helping with her weight control.  She has been very pleased with the Trulicity up until now.  Her A1c's were running up above 7 prior to adding Trulicity and most recent A1c was 6.6%.  She had abnormal contour to school and plain x-rays were unremarkable.  Patient's insurance would not cover for imaging so we sent her to neurosurgery and they obtain CT head.  This showed question of basal ganglia lacunar infarct versus incidental finding.  She was given reassurance regarding her skull contour.  No evidence for prior injury.  No recent  headaches.  Past Medical History:  Diagnosis Date  . Allergy   . Asthma    seasonal - uses inhalers daily during fall  . Diabetes mellitus    fasting 130s  . GERD (gastroesophageal reflux disease)   . Pneumonia    2 yrs   Past Surgical History:  Procedure Laterality Date  . ABDOMINAL HYSTERECTOMY  99  . BREAST BIOPSY Left 11/10/2012   Procedure: BREAST BIOPSY WITH NEEDLE LOCALIZATION;  Surgeon: Rolm Bookbinder, MD;  Location: [place];  Service: General;  Laterality: Left;  . BREAST SURGERY Bilateral    breast implants  . COLONOSCOPY    . NASAL SINUS SURGERY  1980    reports that she has never smoked. She has never used smokeless tobacco. She reports that she does not drink alcohol and does not use drugs. family history includes Alcohol abuse in her father; Breast cancer in her maternal grandmother and paternal grandmother; Cancer in her maternal grandmother and paternal grandmother. Allergies  Allergen Reactions  . Penicillin G   . Penicillins Hives and Swelling      Observations/Objective: Patient sounds cheerful and well on the phone. I do not appreciate any SOB. Speech and thought processing are grossly intact. Patient reported vitals:  Assessment and Plan:  Type 2 diabetes-history of good control but insurance company stating they would not cover Trulicity after the first of the year.  We feel that she needs to stay on GLP-1 medication as she was not having good control with Metformin.  She has also struggled with weight control.  -She will explore to see if they  have another covered alternative.  If not we will look at trying to get prior authorization  Follow Up Instructions:  -Within the next 3 months to reassess diabetes control   99441 5-10 99442 11-20 99443 21-30 I did not refer this patient for an OV in the next 24 hours for this/these issue(s).  I discussed the assessment and treatment plan with the patient. The patient was provided an opportunity to  ask questions and all were answered. The patient agreed with the plan and demonstrated an understanding of the instructions.   The patient was advised to call back or seek an in-person evaluation if the symptoms worsen or if the condition fails to improve as anticipated.  I provided 18 minutes of non-face-to-face time during this encounter.   Carolann Littler, MD

## 2020-01-01 ENCOUNTER — Telehealth: Payer: Self-pay | Admitting: Family Medicine

## 2020-01-01 NOTE — Telephone Encounter (Signed)
Can we initiate prior authorization for the Trulicity?  I had also asked her to check with insurance to see if they would cover an alternative to Trulicity (eg Ozempic)

## 2020-01-01 NOTE — Telephone Encounter (Signed)
Patient is calling and wanted to let Dr. Elease Hashimoto know that insurance has denied Trulicity starting 4129. Pt stated that insurance will approve medication if Dr. Sheldon Silvan call [phone], please advise. CB is [phone]

## 2020-01-02 NOTE — Telephone Encounter (Signed)
Prior Auth started for Tulicity  [id number]  Patient is aware.  Patient has called her insurance and if the Prior Auth for Trulicity does not go through, then she would like to try an Prior Auth for [place].

## 2020-01-08 ENCOUNTER — Other Ambulatory Visit: Payer: Self-pay | Admitting: Family Medicine

## 2020-01-09 ENCOUNTER — Other Ambulatory Visit: Payer: Self-pay

## 2020-01-09 ENCOUNTER — Encounter (HOSPITAL_BASED_OUTPATIENT_CLINIC_OR_DEPARTMENT_OTHER): Payer: Self-pay | Admitting: General Surgery

## 2020-01-11 ENCOUNTER — Encounter (HOSPITAL_BASED_OUTPATIENT_CLINIC_OR_DEPARTMENT_OTHER)
Admission: RE | Admit: 2020-01-11 | Discharge: 2020-01-11 | Disposition: A | Discharge status: Home / Self Care | Payer: Managed Care, Other (non HMO) | Source: Ambulatory Visit | Attending: General Surgery | Admitting: General Surgery

## 2020-01-11 DIAGNOSIS — Z01812 Encounter for preprocedural laboratory examination: Secondary | ICD-10-CM | POA: Insufficient documentation

## 2020-01-11 LAB — BASIC METABOLIC PANEL
Anion gap: 11 (ref 5–15)
BUN: 21 mg/dL (ref 8–23)
CO2: 23 mmol/L (ref 22–32)
Calcium: 9.2 mg/dL (ref 8.9–10.3)
Chloride: 102 mmol/L (ref 98–111)
Creatinine, Ser: 0.65 mg/dL (ref 0.44–1.00)
GFR, Estimated: >60 mL/min (ref >60)
Glucose, Bld: 175 mg/dL — ABNORMAL HIGH (ref 70–99)
Potassium: 4.7 mmol/L (ref 3.5–5.1)
Sodium: 136 mmol/L (ref 135–145)

## 2020-01-11 MED ORDER — ENSURE PRE-SURGERY PO LIQD: 296.0000 mL | Freq: Once | ORAL | Status: DC

## 2020-01-11 NOTE — Progress Notes (Signed)

## 2020-01-12 ENCOUNTER — Other Ambulatory Visit (HOSPITAL_COMMUNITY)
Admission: RE | Admit: 2020-01-12 | Discharge: 2020-01-12 | Disposition: A | Discharge status: Home / Self Care | Payer: Managed Care, Other (non HMO) | Source: Ambulatory Visit | Attending: General Surgery | Admitting: General Surgery

## 2020-01-12 DIAGNOSIS — Z20822 Contact with and (suspected) exposure to covid-19: Secondary | ICD-10-CM | POA: Insufficient documentation

## 2020-01-12 DIAGNOSIS — Z01812 Encounter for preprocedural laboratory examination: Secondary | ICD-10-CM | POA: Insufficient documentation

## 2020-01-12 LAB — SARS CORONAVIRUS 2 (TAT 6-24 HRS): SARS Coronavirus 2: NEGATIVE

## 2020-01-15 ENCOUNTER — Other Ambulatory Visit: Payer: Self-pay

## 2020-01-15 ENCOUNTER — Ambulatory Visit
Admission: RE | Admit: 2020-01-15 | Discharge: 2020-01-15 | Disposition: A | Discharge status: Home / Self Care | Payer: Managed Care, Other (non HMO) | Source: Ambulatory Visit | Attending: General Surgery | Admitting: General Surgery

## 2020-01-15 DIAGNOSIS — N6081 Other benign mammary dysplasias of right breast: Secondary | ICD-10-CM

## 2020-01-15 NOTE — Anesthesia Preprocedure Evaluation (Addendum)
Anesthesia Evaluation  Patient identified by MRN, date of birth, ID band Patient awake    Reviewed: Allergy & Precautions, H&P , NPO status , Patient's Chart, lab work & pertinent test results  Airway Mallampati: II  TM Distance: >3 FB Neck ROM: Full    Dental no notable dental hx. (+) Teeth Intact, Dental Advisory Given   Pulmonary neg pulmonary ROS, asthma ,    Pulmonary exam normal breath sounds clear to auscultation       Cardiovascular hypertension, Pt. on medications  Rhythm:Regular Rate:Normal  04/24/19  EKG SR R 84   Neuro/Psych negative neurological ROS  negative psych ROS   GI/Hepatic Neg liver ROS, GERD  Medicated,  Endo/Other  diabetes, Type 2, Oral Hypoglycemic Agents  Renal/GU negative Renal ROS  negative genitourinary   Musculoskeletal negative musculoskeletal ROS (+)   Abdominal (+) + obese,   Peds  Hematology negative hematology ROS (+)   Anesthesia Other Findings   Reproductive/Obstetrics negative OB ROS                           Anesthesia Physical Anesthesia Plan  ASA: II  Anesthesia Plan: General   Post-op Pain Management:    Induction: Intravenous  PONV Risk Score and Plan: 4 or greater and Ondansetron, Midazolam and Dexamethasone  Airway Management Planned: LMA  Additional Equipment: None  Intra-op Plan:   Post-operative Plan: Extubation in OR  Informed Consent: I have reviewed the patients History and Physical, chart, labs and discussed the procedure including the risks, benefits and alternatives for the proposed anesthesia with the patient or authorized representative who has indicated his/her understanding and acceptance.     Dental advisory given  Plan Discussed with: CRNA and Anesthesiologist  Anesthesia Plan Comments:        Anesthesia Quick Evaluation

## 2020-01-16 ENCOUNTER — Ambulatory Visit (HOSPITAL_BASED_OUTPATIENT_CLINIC_OR_DEPARTMENT_OTHER)
Admission: RE | Admit: 2020-01-16 | Discharge: 2020-01-16 | Disposition: A | Discharge status: Home / Self Care | Payer: Managed Care, Other (non HMO) | Attending: General Surgery | Admitting: General Surgery

## 2020-01-16 ENCOUNTER — Ambulatory Visit (HOSPITAL_BASED_OUTPATIENT_CLINIC_OR_DEPARTMENT_OTHER): Payer: Managed Care, Other (non HMO) | Admitting: Anesthesiology

## 2020-01-16 ENCOUNTER — Encounter (HOSPITAL_BASED_OUTPATIENT_CLINIC_OR_DEPARTMENT_OTHER)
Admission: RE | Disposition: A | Discharge status: Home / Self Care | Payer: Self-pay | Source: Home / Self Care | Attending: General Surgery

## 2020-01-16 ENCOUNTER — Other Ambulatory Visit: Payer: Self-pay

## 2020-01-16 ENCOUNTER — Encounter (HOSPITAL_BASED_OUTPATIENT_CLINIC_OR_DEPARTMENT_OTHER): Payer: Self-pay | Admitting: General Surgery

## 2020-01-16 ENCOUNTER — Ambulatory Visit
Admission: RE | Admit: 2020-01-16 | Discharge: 2020-01-16 | Disposition: A | Discharge status: Home / Self Care | Payer: Managed Care, Other (non HMO) | Source: Ambulatory Visit | Attending: General Surgery | Admitting: General Surgery

## 2020-01-16 DIAGNOSIS — Z79899 Other long term (current) drug therapy: Secondary | ICD-10-CM | POA: Diagnosis not present

## 2020-01-16 DIAGNOSIS — N62 Hypertrophy of breast: Secondary | ICD-10-CM | POA: Diagnosis not present

## 2020-01-16 DIAGNOSIS — Z803 Family history of malignant neoplasm of breast: Secondary | ICD-10-CM | POA: Insufficient documentation

## 2020-01-16 DIAGNOSIS — Z90710 Acquired absence of both cervix and uterus: Secondary | ICD-10-CM | POA: Insufficient documentation

## 2020-01-16 DIAGNOSIS — Z880 Allergy status to penicillin: Secondary | ICD-10-CM | POA: Insufficient documentation

## 2020-01-16 DIAGNOSIS — Z811 Family history of alcohol abuse and dependence: Secondary | ICD-10-CM | POA: Diagnosis not present

## 2020-01-16 DIAGNOSIS — N63 Unspecified lump in unspecified breast: Secondary | ICD-10-CM | POA: Diagnosis present

## 2020-01-16 DIAGNOSIS — N6081 Other benign mammary dysplasias of right breast: Secondary | ICD-10-CM

## 2020-01-16 DIAGNOSIS — Z7984 Long term (current) use of oral hypoglycemic drugs: Secondary | ICD-10-CM | POA: Insufficient documentation

## 2020-01-16 DIAGNOSIS — E119 Type 2 diabetes mellitus without complications: Secondary | ICD-10-CM | POA: Insufficient documentation

## 2020-01-16 DIAGNOSIS — Z8261 Family history of arthritis: Secondary | ICD-10-CM | POA: Insufficient documentation

## 2020-01-16 HISTORY — PX: RADIOACTIVE SEED GUIDED EXCISIONAL BREAST BIOPSY: SHX6490

## 2020-01-16 HISTORY — DX: Essential (primary) hypertension: I10

## 2020-01-16 HISTORY — DX: Snoring: R06.83

## 2020-01-16 LAB — GLUCOSE, CAPILLARY
Glucose-Capillary: 152 mg/dL — ABNORMAL HIGH (ref 70–99)
Glucose-Capillary: 131 mg/dL — ABNORMAL HIGH (ref 70–99)

## 2020-01-16 SURGERY — RADIOACTIVE SEED GUIDED BREAST BIOPSY
Anesthesia: General | Site: Breast | Laterality: Right

## 2020-01-16 MED ORDER — LACTATED RINGERS IV SOLN: INTRAVENOUS | Status: DC

## 2020-01-16 MED ORDER — AMISULPRIDE (ANTIEMETIC) 5 MG/2ML IV SOLN: 10.0000 mg | Freq: Once | INTRAVENOUS | Status: DC | PRN

## 2020-01-16 MED ORDER — ACETAMINOPHEN 500 MG PO TABS
1000.0000 mg | ORAL_TABLET | ORAL | Status: AC
  Administered 2020-01-16: 1000 mg via ORAL

## 2020-01-16 MED ORDER — ONDANSETRON HCL 4 MG/2ML IJ SOLN: 4.0000 mg | Freq: Once | INTRAMUSCULAR | Status: DC | PRN

## 2020-01-16 MED ORDER — HYDROMORPHONE HCL 1 MG/ML IJ SOLN: 0.2500 mg | INTRAMUSCULAR | Status: DC | PRN

## 2020-01-16 MED ORDER — ONDANSETRON HCL 4 MG/2ML IJ SOLN
INTRAMUSCULAR | Status: AC
  Filled 2020-01-16: qty 2

## 2020-01-16 MED ORDER — DEXAMETHASONE SODIUM PHOSPHATE 10 MG/ML IJ SOLN
INTRAMUSCULAR | Status: DC | PRN
  Administered 2020-01-16: 8 mg via INTRAVENOUS

## 2020-01-16 MED ORDER — LIDOCAINE HCL (CARDIAC) PF 100 MG/5ML IV SOSY
PREFILLED_SYRINGE | INTRAVENOUS | Status: DC | PRN
  Administered 2020-01-16: 60 mg via INTRATRACHEAL

## 2020-01-16 MED ORDER — ACETAMINOPHEN 500 MG PO TABS
ORAL_TABLET | ORAL | Status: AC
  Filled 2020-01-16: qty 2

## 2020-01-16 MED ORDER — ACETAMINOPHEN 10 MG/ML IV SOLN: 1000.0000 mg | Freq: Once | INTRAVENOUS | Status: DC | PRN

## 2020-01-16 MED ORDER — PROPOFOL 10 MG/ML IV BOLUS
INTRAVENOUS | Status: DC | PRN
  Administered 2020-01-16: 140 mg via INTRAVENOUS

## 2020-01-16 MED ORDER — FENTANYL CITRATE (PF) 250 MCG/5ML IJ SOLN
INTRAMUSCULAR | Status: DC | PRN
  Administered 2020-01-16: 25 ug via INTRAVENOUS
  Administered 2020-01-16: 50 ug via INTRAVENOUS
  Administered 2020-01-16: 25 ug via INTRAVENOUS

## 2020-01-16 MED ORDER — EPHEDRINE 5 MG/ML INJ
INTRAVENOUS | Status: AC
  Filled 2020-01-16: qty 60

## 2020-01-16 MED ORDER — CEFAZOLIN SODIUM-DEXTROSE 2-4 GM/100ML-% IV SOLN
INTRAVENOUS | Status: AC
  Filled 2020-01-16: qty 100

## 2020-01-16 MED ORDER — ONDANSETRON HCL 4 MG/2ML IJ SOLN
INTRAMUSCULAR | Status: DC | PRN
  Administered 2020-01-16: 4 mg via INTRAVENOUS

## 2020-01-16 MED ORDER — BUPIVACAINE HCL (PF) 0.25 % IJ SOLN
INTRAMUSCULAR | Status: DC | PRN
  Administered 2020-01-16: 10 mL

## 2020-01-16 MED ORDER — DEXAMETHASONE SODIUM PHOSPHATE 10 MG/ML IJ SOLN
INTRAMUSCULAR | Status: AC
  Filled 2020-01-16: qty 1

## 2020-01-16 MED ORDER — MIDAZOLAM HCL 2 MG/2ML IJ SOLN
INTRAMUSCULAR | Status: AC
  Filled 2020-01-16: qty 2

## 2020-01-16 MED ORDER — MIDAZOLAM HCL 2 MG/2ML IJ SOLN
INTRAMUSCULAR | Status: DC | PRN
  Administered 2020-01-16: 2 mg via INTRAVENOUS

## 2020-01-16 MED ORDER — FENTANYL CITRATE (PF) 100 MCG/2ML IJ SOLN: 25.0000 ug | INTRAMUSCULAR | Status: DC | PRN

## 2020-01-16 MED ORDER — BUPIVACAINE HCL (PF) 0.25 % IJ SOLN
INTRAMUSCULAR | Status: AC
  Filled 2020-01-16: qty 90

## 2020-01-16 MED ORDER — HYDROCODONE-ACETAMINOPHEN 7.5-325 MG PO TABS: 1.0000 | ORAL_TABLET | Freq: Once | ORAL | Status: DC | PRN

## 2020-01-16 MED ORDER — CEFAZOLIN SODIUM-DEXTROSE 2-4 GM/100ML-% IV SOLN
2.0000 g | INTRAVENOUS | Status: AC
  Administered 2020-01-16: 2 g via INTRAVENOUS

## 2020-01-16 MED ORDER — MEPERIDINE HCL 25 MG/ML IJ SOLN: 6.2500 mg | INTRAMUSCULAR | Status: DC | PRN

## 2020-01-16 MED ORDER — KETOROLAC TROMETHAMINE 15 MG/ML IJ SOLN
15.0000 mg | INTRAMUSCULAR | Status: AC
  Administered 2020-01-16: 15 mg via INTRAVENOUS

## 2020-01-16 MED ORDER — FENTANYL CITRATE (PF) 100 MCG/2ML IJ SOLN
INTRAMUSCULAR | Status: AC
  Filled 2020-01-16: qty 2

## 2020-01-16 MED ORDER — KETOROLAC TROMETHAMINE 15 MG/ML IJ SOLN
INTRAMUSCULAR | Status: AC
  Filled 2020-01-16: qty 1

## 2020-01-16 SURGICAL SUPPLY — 58 items
ADH SKN CLS APL DERMABOND .7 (GAUZE/BANDAGES/DRESSINGS) ×1
APL PRP STRL LF DISP 70% ISPRP (MISCELLANEOUS) ×1
APPLIER CLIP 9.375 MED OPEN (MISCELLANEOUS)
APR CLP MED 9.3 20 MLT OPN (MISCELLANEOUS)
BINDER BREAST LRG (GAUZE/BANDAGES/DRESSINGS) IMPLANT
BINDER BREAST MEDIUM (GAUZE/BANDAGES/DRESSINGS) IMPLANT
BINDER BREAST XLRG (GAUZE/BANDAGES/DRESSINGS) IMPLANT
BINDER BREAST XXLRG (GAUZE/BANDAGES/DRESSINGS) IMPLANT
BLADE SURG 15 STRL LF DISP TIS (BLADE) ×1 IMPLANT
BLADE SURG 15 STRL SS (BLADE) ×2
CANISTER SUC SOCK COL 7IN (MISCELLANEOUS) IMPLANT
CANISTER SUCT 1200ML W/VALVE (MISCELLANEOUS) IMPLANT
CHLORAPREP W/TINT 26 (MISCELLANEOUS) ×2 IMPLANT
CLIP APPLIE 9.375 MED OPEN (MISCELLANEOUS) IMPLANT
CLIP VESOCCLUDE SM WIDE 6/CT (CLIP) IMPLANT
COVER BACK TABLE 60X90IN (DRAPES) ×2 IMPLANT
COVER MAYO STAND STRL (DRAPES) ×2 IMPLANT
COVER PROBE W GEL 5X96 (DRAPES) ×1 IMPLANT
COVER WAND RF STERILE (DRAPES) IMPLANT
DECANTER SPIKE VIAL GLASS SM (MISCELLANEOUS) IMPLANT
DERMABOND ADVANCED (GAUZE/BANDAGES/DRESSINGS) ×1
DERMABOND ADVANCED .7 DNX12 (GAUZE/BANDAGES/DRESSINGS) ×1 IMPLANT
DRAPE GAMMA PROBE CRDLSS 10X38 (DRAPES) ×1 IMPLANT
DRAPE LAPAROSCOPIC ABDOMINAL (DRAPES) ×2 IMPLANT
DRAPE UTILITY XL STRL (DRAPES) ×2 IMPLANT
DRSG TEGADERM 4X4.75 (GAUZE/BANDAGES/DRESSINGS) IMPLANT
ELECT COATED BLADE 2.86 ST (ELECTRODE) ×2 IMPLANT
ELECT REM PT RETURN 9FT ADLT (ELECTROSURGICAL) ×2
ELECTRODE REM PT RTRN 9FT ADLT (ELECTROSURGICAL) ×1 IMPLANT
GAUZE SPONGE 4X4 12PLY STRL LF (GAUZE/BANDAGES/DRESSINGS) IMPLANT
GLOVE BIO SURGEON STRL SZ7 (GLOVE) ×4 IMPLANT
GLOVE BIOGEL PI IND STRL 7.5 (GLOVE) ×1 IMPLANT
GLOVE BIOGEL PI INDICATOR 7.5 (GLOVE) ×1
GOWN STRL REUS W/ TWL LRG LVL3 (GOWN DISPOSABLE) ×2 IMPLANT
GOWN STRL REUS W/TWL LRG LVL3 (GOWN DISPOSABLE) ×4
HEMOSTAT ARISTA ABSORB 3G PWDR (HEMOSTASIS) IMPLANT
KIT MARKER MARGIN INK (KITS) ×2 IMPLANT
NDL HYPO 25X1 1.5 SAFETY (NEEDLE) ×1 IMPLANT
NEEDLE HYPO 25X1 1.5 SAFETY (NEEDLE) ×2 IMPLANT
NS IRRIG 1000ML POUR BTL (IV SOLUTION) IMPLANT
PACK BASIN DAY SURGERY FS (CUSTOM PROCEDURE TRAY) ×2 IMPLANT
PENCIL SMOKE EVACUATOR (MISCELLANEOUS) ×2 IMPLANT
RETRACTOR ONETRAX LX 90X20 (MISCELLANEOUS) IMPLANT
SLEEVE SCD COMPRESS KNEE MED (MISCELLANEOUS) ×2 IMPLANT
SPONGE LAP 4X18 RFD (DISPOSABLE) ×2 IMPLANT
STRIP CLOSURE SKIN 1/2X4 (GAUZE/BANDAGES/DRESSINGS) ×2 IMPLANT
SUT MNCRL AB 4-0 PS2 18 (SUTURE) IMPLANT
SUT MON AB 5-0 PS2 18 (SUTURE) IMPLANT
SUT SILK 2 0 SH (SUTURE) IMPLANT
SUT VIC AB 2-0 SH 27 (SUTURE) ×2
SUT VIC AB 2-0 SH 27XBRD (SUTURE) ×1 IMPLANT
SUT VIC AB 3-0 SH 27 (SUTURE) ×2
SUT VIC AB 3-0 SH 27X BRD (SUTURE) ×1 IMPLANT
SYR CONTROL 10ML LL (SYRINGE) ×2 IMPLANT
TOWEL GREEN STERILE FF (TOWEL DISPOSABLE) ×2 IMPLANT
TRAY FAXITRON CT DISP (TRAY / TRAY PROCEDURE) ×2 IMPLANT
TUBE CONNECTING 20X1/4 (TUBING) IMPLANT
YANKAUER SUCT BULB TIP NO VENT (SUCTIONS) IMPLANT

## 2020-01-16 NOTE — Transfer of Care (Signed)
Immediate Anesthesia Transfer of Care Note  Patient: Sherry Gallagher  Procedure(s) Performed: RIGHT BREAST RADIOACTIVE SEED GUIDED EXCISIONAL BREAST BIOPSY (Right Breast)  Patient Location: PACU  Anesthesia Type:General  Level of Consciousness: drowsy and patient cooperative  Airway & Oxygen Therapy: Patient Spontanous Breathing and Patient connected to face mask oxygen  Post-op Assessment: Report given to RN and Post -op Vital signs reviewed and stable  Post vital signs: Reviewed and stable  Last Vitals:  Vitals Value Taken Time  BP 122/72 01/16/20 0915  Temp    Pulse 90 01/16/20 0915  Resp 12 01/16/20 0915  SpO2 100 % 01/16/20 0915  Vitals shown include unvalidated device data.  Last Pain:  Vitals:   01/16/20 0741  TempSrc: Oral  PainSc: 0-No pain      Patients Stated Pain Goal: 3 (16/10/96 0454)  Complications: No complications documented.

## 2020-01-16 NOTE — Interval H&P Note (Signed)
History and Physical Interval Note:  01/16/2020 8:26 AM  Sherry Gallagher  has presented today for surgery, with the diagnosis of RIGHT BREAST ATYPICAL LOBULAR HYPERPLASIA, FLAT EPITHELIAL ATYPIA.  The various methods of treatment have been discussed with the patient and family. After consideration of risks, benefits and other options for treatment, the patient has consented to  Procedure(s): RIGHT BREAST RADIOACTIVE SEED GUIDED EXCISIONAL BREAST BIOPSY (Right) as a surgical intervention.  The patient's history has been reviewed, patient examined, no change in status, stable for surgery.  I have reviewed the patient's chart and labs.  Questions were answered to the patient's satisfaction.     Rolm Bookbinder

## 2020-01-16 NOTE — Anesthesia Procedure Notes (Signed)
Procedure Name: LMA Insertion Date/Time: 01/16/2020 8:47 AM Performed by: Kathryne Hitch, CRNA Pre-anesthesia Checklist: Patient identified, Emergency Drugs available, Patient being monitored and Suction available Patient Re-evaluated:Patient Re-evaluated prior to induction Oxygen Delivery Method: Circle system utilized Preoxygenation: Pre-oxygenation with 100% oxygen Induction Type: IV induction Ventilation: Mask ventilation without difficulty LMA: LMA inserted LMA Size: 4.0 Placement Confirmation: positive ETCO2 and breath sounds checked- equal and bilateral Tube secured with: Tape Dental Injury: Teeth and Oropharynx as per pre-operative assessment

## 2020-01-16 NOTE — Anesthesia Postprocedure Evaluation (Signed)
Anesthesia Post Note  Patient: Sherry Gallagher  Procedure(s) Performed: RIGHT BREAST RADIOACTIVE SEED GUIDED EXCISIONAL BREAST BIOPSY (Right Breast)     Patient location during evaluation: PACU Anesthesia Type: General Level of consciousness: awake and alert Pain management: pain level controlled Vital Signs Assessment: post-procedure vital signs reviewed and stable Respiratory status: spontaneous breathing, nonlabored ventilation and respiratory function stable Cardiovascular status: blood pressure returned to baseline and stable Postop Assessment: no apparent nausea or vomiting Anesthetic complications: no   No complications documented.  Last Vitals:  Vitals:   01/16/20 0945 01/16/20 1006  BP: 113/75 125/71  Pulse: 80 81  Resp: 15 18  Temp:  36.4 C  SpO2: 100% 99%    Last Pain:  Vitals:   01/16/20 1006  TempSrc:   PainSc: 0-No pain                 Erial Fikes,W. EDMOND

## 2020-01-16 NOTE — Op Note (Signed)
Preoperative diagnosis: right breast mass with biopsy c/w adh and fea  Postoperative diagnosis: Same as above Procedure: Right breast seed guided excisional biopsy Surgeon: Dr. Serita Grammes Anesthesia: General Specimens:  Right breast tissue marked with paint containing seed and clip Estimated blood loss: Minimal Complications: None Drains: None Special count was correct completion Disposition recovery stable condition  Indications: 56 yof who I know from excision of papilloma in 2014 who presents after undergoing screening mm with calcs on right side. she has fh in 2 gm at age [age]. no mass or dc. she did have implants which were removed a couple years ago. she has a 4 mm group of calcs in lateral right breast, b density. biopsy was done showing alh and fea. We discussed excisional biopsy.   Procedure: After informed consent was obtained the patient was taken to the operating room.  She had a seed placed prior to beginning.  I had these mammograms in the operating room.  She was given antibiotics.  SCDs were placed.  She was placed under general anesthesia without complication.  She was prepped and draped in the standard sterile surgical fashion.  A surgical timeout was then performed.   The seed was in the lateral right breast.  I infiltrated marcaine all around this and made a periareolar incision.  I removed the seed and surrounding tissue. This was marked with paint and passed off the table.  The clip and the seed were in the specimen mammogram.   Hemostasis was observed.  I closed the breast tissue with 2-0 Vicryl.  The skin was closed with 3-0 Vicryl and 5-0 Monocryl.  Glue and Steri-Strips were applied.  She tolerated this well was extubated and transferred to recovery stable.

## 2020-01-16 NOTE — H&P (Signed)
64 yof who I know from excision of papilloma in 2014 who presents after undergoing screening mm with calcs on right side. she has fh in 2 gm at age 64/64. no mass or dc. she did have implants which were removed a couple years ago. she has a 4 mm group of calcs in lateral right breast, b density. biopsy was done showing alh and fea. she is here to discuss options   Past Surgical History Illene Regulus, CMA; 12/21/2019 9:13 AM) Breast Augmentation  Bilateral. Breast Biopsy  Bilateral. Hip Surgery  Left. Hysterectomy (not due to cancer) - Complete  Mammoplasty; Reduction  Bilateral.  Diagnostic Studies History Illene Regulus, CMA; 12/21/2019 9:13 AM) Colonoscopy  1-5 years ago Mammogram  within last year Pap Smear  1-5 years ago  Allergies Illene Regulus, CMA; 12/21/2019 9:14 AM) Penicillins   Medication History (Alisha Spillers, CMA; 12/21/2019 9:15 AM) Benazepril-hydroCHLOROthiazide (20-25MG  Tablet, Oral) Active. metFORMIN HCl (1000MG  Tablet, Oral) Active. Pravastatin Sodium (40MG  Tablet, Oral) Active. Trulicity (0.75MG /0.5ML Soln Pen-inj, Subcutaneous) Active. ZyrTEC Allergy (10MG  Tablet, Oral) Active. Medications Reconciled  Social History Illene Regulus, CMA; 12/21/2019 9:13 AM) Alcohol use  Occasional alcohol use. Caffeine use  Carbonated beverages, Coffee, Tea. No drug use  Tobacco use  Never smoker.  Family History Illene Regulus, [place]; 12/21/2019 9:13 AM) Alcohol Abuse  Father. Arthritis  Mother. Breast Cancer  Family Members In General.  Pregnancy / Birth History Illene Regulus, [place]; 12/21/2019 9:13 AM) Age at menarche  34 years. Age of menopause  [age] Contraceptive History  Oral contraceptives. Gravida  0 Para  0  Other Problems (Alisha Spillers, CMA; 12/21/2019 9:13 AM) Asthma  Diabetes Mellitus  Gastroesophageal Reflux Disease     Review of Systems (Alisha Spillers CMA; 12/21/2019 9:13 AM) General Not  Present- Appetite Loss, Chills, Fatigue, Fever, Night Sweats, Weight Gain and Weight Loss. Skin Not Present- Change in Wart/Mole, Dryness, Hives, Jaundice, New Lesions, Non-Healing Wounds, Rash and Ulcer. HEENT Present- Seasonal Allergies and Wears glasses/contact lenses. Not Present- Earache, Hearing Loss, Hoarseness, Nose Bleed, Oral Ulcers, Ringing in the Ears, Sinus Pain, Sore Throat, Visual Disturbances and Yellow Eyes. Respiratory Not Present- Bloody sputum, Chronic Cough, Difficulty Breathing, Snoring and Wheezing. Cardiovascular Not Present- Chest Pain, Difficulty Breathing Lying Down, Leg Cramps, Palpitations, Rapid Heart Rate, Shortness of Breath and Swelling of Extremities. Gastrointestinal Not Present- Abdominal Pain, Bloating, Bloody Stool, Change in Bowel Habits, Chronic diarrhea, Constipation, Difficulty Swallowing, Excessive gas, Gets full quickly at meals, Hemorrhoids, Indigestion, Nausea, Rectal Pain and Vomiting. Female Genitourinary Not Present- Frequency, Nocturia, Painful Urination, Pelvic Pain and Urgency. Neurological Not Present- Decreased Memory, Fainting, Headaches, Numbness, Seizures, Tingling, Tremor, Trouble walking and Weakness. Endocrine Not Present- Cold Intolerance, Excessive Hunger, Hair Changes, Heat Intolerance, Hot flashes and New Diabetes. Hematology Not Present- Blood Thinners, Easy Bruising, Excessive bleeding, Gland problems, HIV and Persistent Infections.  Vitals (Alisha Spillers CMA; 12/21/2019 9:14 AM) 12/21/2019 9:14 AM Weight: 209 lb Height: 66in Body Surface Area: 2.04 m Body Mass Index: 33.73 kg/m  Pulse: 90 (Regular)  BP: 110/74(Sitting, Left Arm, Standard)       Physical Exam Rolm Bookbinder MD; 12/21/2019 10:07 AM) General Mental Status-Alert. Orientation-Oriented X3.  Breast Nipples-No Discharge. Breast Lump-No Palpable Breast Mass.  Lymphatic Head & Neck  General Head & Neck Lymphatics: Bilateral -  Description - Normal. Axillary  General Axillary Region: Bilateral - Description - Normal. 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 discussed seed guided excision, risk and recovery. will schedule

## 2020-01-16 NOTE — Discharge Instructions (Signed)
[facility] Office Phone Number [phone]  POST OP INSTRUCTIONS Take 400 mg of ibuprofen every 8 hours or 650 mg tylenol every 6 hours for next 72 hours then as needed. Use ice several times daily also. Always review your discharge instruction sheet given to you by the facility where your surgery was performed.  IF YOU HAVE DISABILITY OR FAMILY LEAVE FORMS, YOU MUST BRING THEM TO THE OFFICE FOR PROCESSING.  DO NOT GIVE THEM TO YOUR DOCTOR.  1. A prescription for pain medication may be given to you upon discharge.  Take your pain medication as prescribed, if needed.  If narcotic pain medicine is not needed, then you may take acetaminophen (Tylenol), naprosyn (Alleve) or ibuprofen (Advil) as needed. No Tylenol or Ibuprofen until 2:00pm if needed. 2. Take your usually prescribed medications unless otherwise directed 3. If you need a refill on your pain medication, please contact your pharmacy.  They will contact our office to request authorization.  Prescriptions will not be filled after 5pm or on week-ends. 4. You should eat very light the first 24 hours after surgery, such as soup, crackers, pudding, etc.  Resume your normal diet the day after surgery. 5. Most patients will experience some swelling and bruising in the breast.  Ice packs and a good support bra will help.  Wear the breast binder provided or a sports bra for 72 hours day and night.  After that wear a sports bra during the day until you return to the office. Swelling and bruising can take several days to resolve.  6. It is common to experience some constipation if taking pain medication after surgery.  Increasing fluid intake and taking a stool softener will usually help or prevent this problem from occurring.  A mild laxative (Milk of Magnesia or Miralax) should be taken according to package directions if there are no bowel movements after 48 hours. 7. Unless discharge instructions indicate otherwise, you may remove your  bandages 48 hours after surgery and you may shower at that time.  You may have steri-strips (small skin tapes) in place directly over the incision.  These strips should be left on the skin for 7-10 days and will come off on their own.  If your surgeon used skin glue on the incision, you may shower in 24 hours.  The glue will flake off over the next 2-3 weeks.  Any sutures or staples will be removed at the office during your follow-up visit. 8. ACTIVITIES:  You may resume regular daily activities (gradually increasing) beginning the next day.  Wearing a good support bra or sports bra minimizes pain and swelling.  You may have sexual intercourse when it is comfortable. a. You may drive when you no longer are taking prescription pain medication, you can comfortably wear a seatbelt, and you can safely maneuver your car and apply brakes. b. RETURN TO WORK:  ______________________________________________________________________________________ 9. You should see your doctor in the office for a follow-up appointment approximately two weeks after your surgery.  Your doctors nurse will typically make your follow-up appointment when she calls you with your pathology report.  Expect your pathology report 3-4 business days after your surgery.  You may call to check if you do not hear from [place] after three days. 10. OTHER INSTRUCTIONS: _______________________________________________________________________________________________ _____________________________________________________________________________________________________________________________________ _____________________________________________________________________________________________________________________________________ _____________________________________________________________________________________________________________________________________  WHEN TO CALL DR WAKEFIELD: 1. Fever over 101.0 2. Nausea and/or vomiting. 3. Extreme swelling  or bruising. 4. Continued bleeding from incision. 5. Increased pain, redness, or drainage from the  incision.  The clinic staff is available to answer your questions during regular business hours.  Please dont hesitate to call and ask to speak to one of the nurses for clinical concerns.  If you have a medical emergency, go to the nearest emergency room or call 911.  A surgeon from [facility] is always on call at the hospital.  For further questions, please visit [email or web address]   Post Anesthesia Home Care Instructions  Activity: Get plenty of rest for the remainder of the day. A responsible individual must stay with you for 24 hours following the procedure.  For the next 24 hours, DO NOT: -Drive a car -Paediatric nurse -Drink alcoholic beverages -Take any medication unless instructed by your physician -Make any legal decisions or sign important papers.  Meals: Start with liquid foods such as gelatin or soup. Progress to regular foods as tolerated. Avoid greasy, spicy, heavy foods. If nausea and/or vomiting occur, drink only clear liquids until the nausea and/or vomiting subsides. Call your physician if vomiting continues.  Special Instructions/Symptoms: Your throat may feel dry or sore from the anesthesia or the breathing tube placed in your throat during surgery. If this causes discomfort, gargle with warm salt water. The discomfort should disappear within 24 hours.  If you had a scopolamine patch placed behind your ear for the management of post- operative nausea and/or vomiting:  1. The medication in the patch is effective for 72 hours, after which it should be removed.  Wrap patch in a tissue and discard in the trash. Wash hands thoroughly with soap and water. 2. You may remove the patch earlier than 72 hours if you experience unpleasant side effects which may include dry mouth, dizziness or visual disturbances. 3. Avoid touching the patch. Wash your  hands with soap and water after contact with the patch.

## 2020-01-18 ENCOUNTER — Encounter (HOSPITAL_BASED_OUTPATIENT_CLINIC_OR_DEPARTMENT_OTHER): Payer: Self-pay | Admitting: General Surgery

## 2020-01-18 LAB — SURGICAL PATHOLOGY

## 2020-01-28 ENCOUNTER — Telehealth: Payer: Self-pay

## 2020-01-28 NOTE — Telephone Encounter (Signed)
New message   Patient drop off form to be completed by Physician.

## 2020-02-06 NOTE — Telephone Encounter (Signed)
Called patient to let them know that the form is complete and ready for pick up

## 2020-03-14 ENCOUNTER — Other Ambulatory Visit: Payer: Self-pay | Admitting: Family Medicine

## 2020-03-14 DIAGNOSIS — E119 Type 2 diabetes mellitus without complications: Secondary | ICD-10-CM

## 2020-04-15 ENCOUNTER — Other Ambulatory Visit: Payer: Self-pay | Admitting: Family Medicine

## 2020-04-15 DIAGNOSIS — J4540 Moderate persistent asthma, uncomplicated: Secondary | ICD-10-CM

## 2020-04-29 ENCOUNTER — Other Ambulatory Visit: Payer: Self-pay | Admitting: Family Medicine

## 2020-05-26 ENCOUNTER — Other Ambulatory Visit: Payer: Self-pay | Admitting: Family Medicine

## 2020-05-30 ENCOUNTER — Telehealth: Payer: Self-pay | Admitting: Podiatry

## 2020-05-30 NOTE — Telephone Encounter (Signed)
Patient is requesting a number to call over the weekend if her ankle gets worse. Would like to speak with Dr. Paulla Dolly if problem worsens.

## 2020-06-05 ENCOUNTER — Ambulatory Visit: Payer: Managed Care, Other (non HMO) | Admitting: Podiatry

## 2020-06-12 ENCOUNTER — Other Ambulatory Visit: Payer: Self-pay | Admitting: Family Medicine

## 2020-06-20 ENCOUNTER — Other Ambulatory Visit: Payer: Self-pay | Admitting: Family Medicine

## 2020-07-17 ENCOUNTER — Other Ambulatory Visit: Payer: Self-pay | Admitting: Family Medicine

## 2020-07-28 ENCOUNTER — Other Ambulatory Visit: Payer: Self-pay

## 2020-07-29 ENCOUNTER — Ambulatory Visit: Payer: Managed Care, Other (non HMO) | Admitting: Family Medicine

## 2020-07-29 VITALS — BP 122/60 | HR 89 | Temp 97.6°F | Wt 182.5 lb

## 2020-07-29 DIAGNOSIS — J4540 Moderate persistent asthma, uncomplicated: Secondary | ICD-10-CM | POA: Diagnosis not present

## 2020-07-29 DIAGNOSIS — I10 Essential (primary) hypertension: Secondary | ICD-10-CM | POA: Diagnosis not present

## 2020-07-29 DIAGNOSIS — E119 Type 2 diabetes mellitus without complications: Secondary | ICD-10-CM

## 2020-07-29 DIAGNOSIS — M109 Gout, unspecified: Secondary | ICD-10-CM

## 2020-07-29 LAB — POCT GLYCOSYLATED HEMOGLOBIN (HGB A1C): Hemoglobin A1C: 5.8 % — AB (ref 4.0–5.6)

## 2020-07-29 MED ORDER — ALBUTEROL SULFATE HFA 108 (90 BASE) MCG/ACT IN AERS: 2.0000 | INHALATION_SPRAY | Freq: Four times a day (QID) | RESPIRATORY_TRACT | 1 refills | Status: DC | PRN

## 2020-07-29 MED ORDER — BENAZEPRIL HCL 20 MG PO TABS: 20.0000 mg | ORAL_TABLET | Freq: Every day | ORAL | 3 refills | Status: DC

## 2020-07-29 MED ORDER — GLUCOSE BLOOD VI STRP: ORAL_STRIP | 2 refills | Status: AC

## 2020-07-29 NOTE — Progress Notes (Signed)
Established Patient Office Visit  Subjective:  Patient ID: Sherry Gallagher, female    DOB: February 28, 1955  Age: 65 y.o. MRN: [id number]  CC:  Chief Complaint  Patient presents with   Foot Pain    Gout flair up, x 3 days, L foot    HPI Sherry Gallagher presents for concern for acute gout involving left foot.  First noted some swelling and pain about 3 days ago.  Has noticed some increased warmth.  No injury.  No fevers or chills.  She states she has had least couple of gout flareups this year.  She had some leftover colchicine but this was slightly out of date and she did not take this.  She has had gout involving feet in the past.  She has recently started a high-protein diet but is staying well-hydrated.  She realizes her diet may be contributing.  She has had good success with weight loss on her current diet  Type 2 diabetes.  Remains on metformin and Trulicity.  She is overdue for follow-up lab.  Last A1c was almost a year ago and 6.6%. She is requesting refill of her test strips  She has asthma which is controlled with Advair.  Requesting refill of albuterol rescue inhaler which she uses infrequently.  She has hypertension which has been controlled with benazepril HCTZ.  Past Medical History:  Diagnosis Date   Allergy    Asthma    seasonal - uses inhalers daily during fall   Diabetes mellitus    fasting 130s   GERD (gastroesophageal reflux disease)    Hypertension    Pneumonia    2 yrs   Snores     Past Surgical History:  Procedure Laterality Date   ABDOMINAL HYSTERECTOMY  99   BREAST BIOPSY Left 11/10/2012   Procedure: BREAST BIOPSY WITH NEEDLE LOCALIZATION;  Surgeon: Rolm Bookbinder, MD;  Location: [facility];  Service: General;  Laterality: Left;   BREAST SURGERY Bilateral    breast implants   COLONOSCOPY     NASAL SINUS SURGERY  1980   RADIOACTIVE SEED GUIDED EXCISIONAL BREAST BIOPSY Right 01/16/2020   Procedure: RIGHT BREAST RADIOACTIVE SEED GUIDED EXCISIONAL  BREAST BIOPSY;  Surgeon: Rolm Bookbinder, MD;  Location: [facility];  Service: General;  Laterality: Right;    Family History  Problem Relation Age of Onset   Alcohol abuse Father    Cancer Maternal Grandmother        breast   Breast cancer Maternal Grandmother    Cancer Paternal Grandmother        breast   Breast cancer Paternal Grandmother     Social History   Socioeconomic History   Marital status: Married    Spouse name: Not on file   Number of children: Not on file   Years of education: Not on file   Highest education level: Not on file  Occupational History   Not on file  Tobacco Use   Smoking status: Never   Smokeless tobacco: Never  Vaping Use   Vaping Use: Never used  Substance and Sexual Activity   Alcohol use: Yes    Comment: social   Drug use: No   Sexual activity: Yes    Birth control/protection: Surgical, Post-menopausal  Other Topics Concern   Not on file  Social History Narrative   Not on file   Social Determinants of Health   Financial Resource Strain: Not on file  Food Insecurity: Not on file  Transportation Needs: Not  on file  Physical Activity: Not on file  Stress: Not on file  Social Connections: Not on file  Intimate Partner Violence: Not on file    Outpatient Medications Prior to Visit  Medication Sig Dispense Refill   aspirin EC 81 MG tablet Take 81 mg by mouth every morning.     cetirizine (ZYRTEC) 10 MG tablet Take 10 mg by mouth every other day. Every other day     COMFORT LANCETS MISC Use as directed.  Ultra Soft Touch Lancets 100 each 11   COVID-19 mRNA vaccine, Pfizer, 30 MCG/0.3ML injection INJECT AS DIRECTED .3 mL 0   esomeprazole (NEXIUM) 40 MG capsule Take 40 mg by mouth every other day.     fluticasone (FLONASE) 50 MCG/ACT nasal spray Place 2 sprays into the nose as needed.      Fluticasone-Salmeterol (ADVAIR DISKUS) 250-50 MCG/DOSE AEPB INHALE ONE PUFF BY MOUTH DAILY AS NEEDED 60 each 1   metFORMIN  (GLUCOPHAGE) 1000 MG tablet TAKE ONE TABLET BY MOUTH EVERY MORNING AND TAKE ONE TABLET BY MOUTH EVERY EVENING 180 tablet 1   Multiple Vitamin (MULTIVITAMIN) tablet Take 1 tablet by mouth daily.     nitrofurantoin, macrocrystal-monohydrate, (MACROBID) 100 MG capsule Take 1 capsule (100 mg total) by mouth 2 (two) times daily. 14 capsule 0   polyethylene glycol (MIRALAX / GLYCOLAX) 17 g packet Take 1 packet by mouth daily.     pravastatin (PRAVACHOL) 40 MG tablet TAKE ONE TABLET BY MOUTH DAILY 90 tablet 3   TRULICITY 9.76 BH/4.1PF SOPN INJECT 0.75 MG UNDER THE SKIN ONCE A WEEK 2 mL 0   VITAMIN D, CHOLECALCIFEROL, PO Take 1 tablet by mouth every evening.     benazepril-hydrochlorthiazide (LOTENSIN HCT) 20-25 MG tablet TAKE 1/2 TABLET BY MOUTH DAILY 45 tablet 1   glucose blood (ONE TOUCH ULTRA TEST) test strip Use to check blood sugar every day. DX: E11.9 100 each 2   No facility-administered medications prior to visit.    Allergies  Allergen Reactions   Penicillin G    Penicillins Hives and Swelling    ROS Review of Systems  Constitutional:  Negative for fatigue.  Eyes:  Negative for visual disturbance.  Respiratory:  Negative for cough, chest tightness, shortness of breath and wheezing.   Cardiovascular:  Negative for chest pain, palpitations and leg swelling.  Endocrine: Negative for polydipsia and polyuria.  Musculoskeletal:  Positive for arthralgias.  Neurological:  Negative for dizziness, seizures, syncope, weakness, light-headedness and headaches.     Objective:    Physical Exam Constitutional:      Appearance: Normal appearance.  Cardiovascular:     Rate and Rhythm: Normal rate and regular rhythm.  Musculoskeletal:     Comments: Left foot reveals some swelling and warmth and tenderness especially involving the second toe but extends to the proximal forefoot.  Minimal tenderness of the MTP joint.  Neurological:     Mental Status: She is alert.    BP 122/60 (BP Location:  Left Arm, Patient Position: Sitting, Cuff Size: Normal)   Pulse 89   Temp 97.6 F (36.4 C) (Oral)   Wt 182 lb 8 oz (82.8 kg)   SpO2 99%   BMI 29.46 kg/m  Wt Readings from Last 3 Encounters:  07/29/20 182 lb 8 oz (82.8 kg)  01/16/20 210 lb 12.2 oz (95.6 kg)  12/31/19 205 lb (93 kg)     Health Maintenance Due  Topic Date Due   Pneumococcal Vaccine 47-51 Years old (1 - PCV)  Never done   HIV Screening  Never done   Zoster Vaccines- Shingrix (1 of 2) Never done   OPHTHALMOLOGY EXAM  09/13/2019   PAP SMEAR-Modifier  09/24/2019   FOOT EXAM  09/27/2019   COVID-19 Vaccine (4 - Booster) 03/12/2020    There are no preventive care reminders to display for this patient.  Lab Results  Component Value Date   TSH 2.32 07/19/2017   Lab Results  Component Value Date   WBC 7.7 04/24/2019   HGB 14.0 04/24/2019   HCT 42.1 04/24/2019   MCV 92.9 04/24/2019   PLT 430.0 (H) 04/24/2019   Lab Results  Component Value Date   NA 136 01/11/2020   K 4.7 01/11/2020   CO2 23 01/11/2020   GLUCOSE 175 (H) 01/11/2020   BUN 21 01/11/2020   CREATININE 0.65 01/11/2020   BILITOT 0.4 09/04/2019   ALKPHOS 71 04/24/2019   AST 20 09/04/2019   ALT 17 09/04/2019   PROT 7.0 09/04/2019   ALBUMIN 4.4 04/24/2019   CALCIUM 9.2 01/11/2020   ANIONGAP 11 01/11/2020   GFR 85.73 04/24/2019   Lab Results  Component Value Date   CHOL 182 09/27/2018   Lab Results  Component Value Date   HDL 52.40 09/27/2018   Lab Results  Component Value Date   LDLCALC 93 09/27/2018   Lab Results  Component Value Date   TRIG 185.0 (H) 09/27/2018   Lab Results  Component Value Date   CHOLHDL 3 09/27/2018   Lab Results  Component Value Date   HGBA1C 5.8 (A) 07/29/2020      Assessment & Plan:   #1 acute gout involving left foot.  She has had fairly frequent flareups in the past.  Current diet may be contributing.  -We discussed acute treatments.  She is reluctant to use prednisone because of her diabetes.   She has colchicine 0.6 mg and recommend starting with 2 tablets initially then 1 every 12 hours -We did discuss prophylaxis with medication such as allopurinol but have decided to first try to eliminate HCTZ which could be contributing.  States combination drug with benazepril HCTZ and blood pressure currently well controlled with her recent weight loss. -Changing over to plain benazepril 20 mg daily and monitor blood pressure closely -Handout on low purine diet given -If continues to have frequent flareups with the above consider allopurinol  #2 hypertension stable.  Making medication changes above  #3 history of moderate persistent asthma currently stable -Refill albuterol to use as needed -Continue daily use of Advair at current dosage  #4 type 2 diabetes with excellent control with A1c today 5.8% likely reflecting her recent weight loss.  #5 hyperlipidemia.  Patient needs follow-up labs.  We plan to get fasting labs at follow-up in 3 months   Meds ordered this encounter  Medications   benazepril (LOTENSIN) 20 MG tablet    Sig: Take 1 tablet (20 mg total) by mouth daily.    Dispense:  90 tablet    Refill:  3   glucose blood (ONE TOUCH ULTRA TEST) test strip    Sig: Use to check blood sugar every day. DX: E11.9    Dispense:  100 each    Refill:  2   albuterol (VENTOLIN HFA) 108 (90 Base) MCG/ACT inhaler    Sig: Inhale 2 puffs into the lungs every 6 (six) hours as needed for wheezing or shortness of breath.    Dispense:  8 g    Refill:  1  Follow-up: Return in about 3 months (around 10/29/2020).    Carolann Littler, MD

## 2020-08-11 ENCOUNTER — Other Ambulatory Visit: Payer: Self-pay | Admitting: Family Medicine

## 2020-09-01 ENCOUNTER — Ambulatory Visit: Payer: Managed Care, Other (non HMO) | Admitting: Podiatry

## 2020-09-10 ENCOUNTER — Other Ambulatory Visit: Payer: Self-pay | Admitting: Family Medicine

## 2020-09-10 DIAGNOSIS — E119 Type 2 diabetes mellitus without complications: Secondary | ICD-10-CM

## 2020-11-02 ENCOUNTER — Other Ambulatory Visit: Payer: Self-pay | Admitting: Family Medicine

## 2020-11-17 ENCOUNTER — Other Ambulatory Visit: Payer: Self-pay | Admitting: Obstetrics & Gynecology

## 2020-11-17 DIAGNOSIS — R928 Other abnormal and inconclusive findings on diagnostic imaging of breast: Secondary | ICD-10-CM

## 2020-11-26 ENCOUNTER — Ambulatory Visit
Admission: RE | Admit: 2020-11-26 | Discharge: 2020-11-26 | Disposition: A | Discharge status: Home / Self Care | Payer: Managed Care, Other (non HMO) | Source: Ambulatory Visit | Attending: Obstetrics & Gynecology | Admitting: Obstetrics & Gynecology

## 2020-11-26 ENCOUNTER — Other Ambulatory Visit: Payer: Self-pay

## 2020-11-26 ENCOUNTER — Ambulatory Visit: Payer: Managed Care, Other (non HMO)

## 2020-11-26 DIAGNOSIS — R928 Other abnormal and inconclusive findings on diagnostic imaging of breast: Secondary | ICD-10-CM

## 2020-12-09 ENCOUNTER — Other Ambulatory Visit: Payer: Self-pay | Admitting: Family Medicine

## 2020-12-28 ENCOUNTER — Other Ambulatory Visit: Payer: Self-pay | Admitting: Family Medicine

## 2021-01-26 ENCOUNTER — Telehealth: Payer: Self-pay

## 2021-01-26 MED ORDER — NITROFURANTOIN MONOHYD MACRO 100 MG PO CAPS
100.0000 mg | ORAL_CAPSULE | Freq: Two times a day (BID) | ORAL | 0 refills | Status: DC
Start: 2021-01-26 — End: 2021-05-13

## 2021-01-26 NOTE — Telephone Encounter (Signed)
Patient called in stating that she has a UTI. Symptoms are frequency, dysuria, x 3 days.  Patient lost her mother this weekend and is unable to come in for a visit due to making funeral arrangements.

## 2021-01-26 NOTE — Telephone Encounter (Signed)
Rx sent in.  Left a detailed message on verified voice mail informing the patient that a Rx for an antibiotic has been sent to her pharmacy.

## 2021-01-31 ENCOUNTER — Other Ambulatory Visit: Payer: Self-pay | Admitting: Family Medicine

## 2021-01-31 DIAGNOSIS — J4540 Moderate persistent asthma, uncomplicated: Secondary | ICD-10-CM

## 2021-02-27 ENCOUNTER — Other Ambulatory Visit: Payer: Self-pay | Admitting: Family Medicine

## 2021-03-09 ENCOUNTER — Other Ambulatory Visit: Payer: Self-pay | Admitting: Family Medicine

## 2021-03-09 DIAGNOSIS — E119 Type 2 diabetes mellitus without complications: Secondary | ICD-10-CM

## 2021-03-24 ENCOUNTER — Encounter (HOSPITAL_COMMUNITY): Payer: Self-pay

## 2021-04-07 ENCOUNTER — Telehealth: Payer: Self-pay | Admitting: Family Medicine

## 2021-04-07 MED ORDER — TRULICITY 0.75 MG/0.5ML ~~LOC~~ SOAJ: SUBCUTANEOUS | 1 refills | Status: DC

## 2021-04-07 NOTE — Telephone Encounter (Signed)
Pt has an appt sch for 05-13-2021 and would like a refill on TRULICITY 5.50 IT/6.4WX SOPN sent to  [facility] [place], [address] AT [address] [place] Phone:  [phone]  Fax:  [phone]

## 2021-04-07 NOTE — Telephone Encounter (Signed)
Rx done. 

## 2021-04-30 ENCOUNTER — Other Ambulatory Visit: Payer: Self-pay | Admitting: Family Medicine

## 2021-05-13 ENCOUNTER — Encounter: Payer: Self-pay | Admitting: Family Medicine

## 2021-05-13 ENCOUNTER — Ambulatory Visit: Payer: Managed Care, Other (non HMO) | Admitting: Family Medicine

## 2021-05-13 VITALS — BP 136/80 | HR 81 | Temp 97.8°F | Ht 66.0 in | Wt 195.6 lb

## 2021-05-13 DIAGNOSIS — E7800 Pure hypercholesterolemia, unspecified: Secondary | ICD-10-CM | POA: Diagnosis not present

## 2021-05-13 DIAGNOSIS — Z23 Encounter for immunization: Secondary | ICD-10-CM

## 2021-05-13 DIAGNOSIS — E119 Type 2 diabetes mellitus without complications: Secondary | ICD-10-CM

## 2021-05-13 DIAGNOSIS — I10 Essential (primary) hypertension: Secondary | ICD-10-CM

## 2021-05-13 LAB — POCT GLYCOSYLATED HEMOGLOBIN (HGB A1C): Hemoglobin A1C: 5.9 % — AB (ref 4.0–5.6)

## 2021-05-13 MED ORDER — TRULICITY 0.75 MG/0.5ML ~~LOC~~ SOAJ: SUBCUTANEOUS | 3 refills | Status: DC

## 2021-05-13 MED ORDER — PRAVASTATIN SODIUM 40 MG PO TABS: 40.0000 mg | ORAL_TABLET | Freq: Every day | ORAL | 3 refills | Status: DC

## 2021-05-13 MED ORDER — BENAZEPRIL HCL 20 MG PO TABS: 20.0000 mg | ORAL_TABLET | Freq: Every day | ORAL | 3 refills | Status: DC

## 2021-05-13 NOTE — Progress Notes (Signed)
? ?Established Patient Office Visit ? ?Subjective:  ?Patient ID: Sherry Gallagher, female    DOB: 1956-02-07  Age: 66 y.o. MRN: [id number] ? ?CC:  ?Chief Complaint  ?Patient presents with  ? Follow-up  ? ? ?HPI ?Sherry Gallagher presents for medical follow-up.  She had very difficult year.  They have had multiple family losses including her mother several months ago.  She feels like because of the stress issues she has been eating more carbs and poor compliance with diet.  However, her blood sugars have been stable.  She remains on Trulicity and metformin.  No side effects.  Needs refills of Trulicity.  Last A1c 5.8%.  She has not been exercising as much either. ? ?She has hypertension treated with benazepril 20 mg daily.  No cough.  Needs refills.  She is on pravastatin 40 mg daily for hyperlipidemia.  Last lipids were almost 2 years ago. ? ?She sees GYN yearly.  Has not had Prevnar 20.  No history of shingles vaccine.  Gets DEXA scans through her GYN. ? ?Past Medical History:  ?Diagnosis Date  ? Allergy   ? Asthma   ? seasonal - uses inhalers daily during fall  ? Diabetes mellitus   ? fasting 130s  ? GERD (gastroesophageal reflux disease)   ? Hypertension   ? Pneumonia   ? 2 yrs  ? Snores   ? ? ?Past Surgical History:  ?Procedure Laterality Date  ? ABDOMINAL HYSTERECTOMY  99  ? BREAST BIOPSY Left 11/10/2012  ? Procedure: BREAST BIOPSY WITH NEEDLE LOCALIZATION;  Surgeon: Rolm Bookbinder, MD;  Location: [place];  Service: General;  Laterality: Left;  ? BREAST SURGERY Bilateral   ? breast implants  ? COLONOSCOPY    ? NASAL SINUS SURGERY  1980  ? RADIOACTIVE SEED GUIDED EXCISIONAL BREAST BIOPSY Right 01/16/2020  ? Procedure: RIGHT BREAST RADIOACTIVE SEED GUIDED EXCISIONAL BREAST BIOPSY;  Surgeon: Rolm Bookbinder, MD;  Location: [place];  Service: General;  Laterality: Right;  ? ? ?Family History  ?Problem Relation Age of Onset  ? Alcohol abuse Father   ? Cancer Maternal Grandmother   ?      breast  ? Breast cancer Maternal Grandmother   ? Cancer Paternal Grandmother   ?     breast  ? Breast cancer Paternal Grandmother   ? ? ?Social History  ? ?Socioeconomic History  ? Marital status: Married  ?  Spouse name: Not on file  ? Number of children: Not on file  ? Years of education: Not on file  ? Highest education level: Bachelor's degree (e.g., BA, AB, BS)  ?Occupational History  ? Not on file  ?Tobacco Use  ? Smoking status: Never  ? Smokeless tobacco: Never  ?Vaping Use  ? Vaping Use: Never used  ?Substance and Sexual Activity  ? Alcohol use: Yes  ?  Comment: social  ? Drug use: No  ? Sexual activity: Yes  ?  Birth control/protection: Surgical, Post-menopausal  ?Other Topics Concern  ? Not on file  ?Social History Narrative  ? Not on file  ? ?Social Determinants of Health  ? ?Financial Resource Strain: Low Risk   ? Difficulty of Paying Living Expenses: Not hard at all  ?Food Insecurity: No Food Insecurity  ? Worried About Charity fundraiser in the Last Year: Never true  ? Ran Out of Food in the Last Year: Never true  ?Transportation Needs: No Transportation Needs  ? Lack of Transportation (Medical): No  ?  Lack of Transportation (Non-Medical): No  ?Physical Activity: Sufficiently Active  ? Days of Exercise per Week: 7 days  ? Minutes of Exercise per Session: 30 min  ?Stress: Stress Concern Present  ? Feeling of Stress : To some extent  ?Social Connections: Socially Integrated  ? Frequency of Communication with Friends and Family: More than three times a week  ? Frequency of Social Gatherings with Friends and Family: More than three times a week  ? Attends Religious Services: More than 4 times per year  ? Active Member of Clubs or Organizations: Yes  ? Attends Archivist Meetings: 1 to 4 times per year  ? Marital Status: Married  ?Intimate Partner Violence: Not on file  ? ? ?Outpatient Medications Prior to Visit  ?Medication Sig Dispense Refill  ? ADVAIR DISKUS 250-50 MCG/ACT AEPB INHALE ONE  PUFF BY MOUTH DAILY AS NEEDED 60 each 1  ? albuterol (VENTOLIN HFA) 108 (90 Base) MCG/ACT inhaler Inhale 2 puffs into the lungs every 6 (six) hours as needed for wheezing or shortness of breath. 8 g 1  ? aspirin EC 81 MG tablet Take 81 mg by mouth every morning.    ? cetirizine (ZYRTEC) 10 MG tablet Take 10 mg by mouth every other day. Every other day    ? COMFORT LANCETS MISC Use as directed.  Ultra Soft Touch Lancets 100 each 11  ? esomeprazole (NEXIUM) 40 MG capsule Take 40 mg by mouth every other day.    ? fluticasone (FLONASE) 50 MCG/ACT nasal spray Place 2 sprays into the nose as needed.     ? glucose blood (ONE TOUCH ULTRA TEST) test strip Use to check blood sugar every day. DX: E11.9 100 each 2  ? metFORMIN (GLUCOPHAGE) 1000 MG tablet TAKE 1 TABLET BY MOUTH EVERY  MORNING AND TAKE ONE TABLET BY MOUTH EVERY EVENING 180 tablet 1  ? Multiple Vitamin (MULTIVITAMIN) tablet Take 1 tablet by mouth daily.    ? polyethylene glycol (MIRALAX / GLYCOLAX) 17 g packet Take 1 packet by mouth daily.    ? VITAMIN D, CHOLECALCIFEROL, PO Take 1 tablet by mouth every evening.    ? benazepril (LOTENSIN) 20 MG tablet Take 1 tablet (20 mg total) by mouth daily. 90 tablet 3  ? nitrofurantoin, macrocrystal-monohydrate, (MACROBID) 100 MG capsule Take 1 capsule (100 mg total) by mouth 2 (two) times daily. 6 capsule 0  ? pravastatin (PRAVACHOL) 40 MG tablet TAKE ONE TABLET BY MOUTH DAILY 90 tablet 3  ? TRULICITY 1.61 WR/6.0AV SOPN INJECT 0.'75MG'$  UNDER THE SKIN ONCE WEEKLY 2 mL 1  ? ?No facility-administered medications prior to visit.  ? ? ?Allergies  ?Allergen Reactions  ? Penicillin G   ? Penicillins Hives and Swelling  ? ? ?ROS ?Review of Systems  ?Constitutional:  Negative for appetite change, fatigue and unexpected weight change.  ?Eyes:  Negative for visual disturbance.  ?Respiratory:  Negative for cough, chest tightness, shortness of breath and wheezing.   ?Cardiovascular:  Negative for chest pain, palpitations and leg swelling.   ?Endocrine: Negative for polydipsia and polyuria.  ?Genitourinary:  Negative for dysuria.  ?Neurological:  Negative for dizziness, seizures, syncope, weakness, light-headedness and headaches.  ? ?  ?Objective:  ?  ?Physical Exam ?Constitutional:   ?   Appearance: She is well-developed.  ?Eyes:  ?   Pupils: Pupils are equal, round, and reactive to light.  ?Neck:  ?   Thyroid: No thyromegaly.  ?   Vascular: No JVD.  ?Cardiovascular:  ?  Rate and Rhythm: Normal rate and regular rhythm.  ?   Heart sounds:  ?  No gallop.  ?Pulmonary:  ?   Effort: Pulmonary effort is normal. No respiratory distress.  ?   Breath sounds: Normal breath sounds. No wheezing or rales.  ?Musculoskeletal:  ?   Cervical back: Neck supple.  ?   Right lower leg: No edema.  ?   Left lower leg: No edema.  ?Skin: ?   Comments: Feet reveal no skin lesions. Good distal foot pulses. Good capillary refill. No calluses.   ?Neurological:  ?   Mental Status: She is alert.  ? ? ?BP 136/80 (BP Location: Left Arm, Patient Position: Sitting, Cuff Size: Normal)   Pulse 81   Temp 97.8 ?F (36.6 ?C) (Oral)   Ht '5\' 6"'$  (1.676 m)   Wt 195 lb 9.6 oz (88.7 kg)   SpO2 97%   BMI 31.57 kg/m?  ?Wt Readings from Last 3 Encounters:  ?05/13/21 195 lb 9.6 oz (88.7 kg)  ?07/29/20 182 lb 8 oz (82.8 kg)  ?01/16/20 210 lb 12.2 oz (95.6 kg)  ? ? ? ?Health Maintenance Due  ?Topic Date Due  ? HIV Screening  Never done  ? Zoster Vaccines- Shingrix (1 of 2) Never done  ? OPHTHALMOLOGY EXAM  09/13/2019  ? PAP SMEAR-Modifier  09/24/2019  ? FOOT EXAM  09/27/2019  ? COVID-19 Vaccine (4 - Booster) 02/05/2020  ? Pneumonia Vaccine 55+ Years old (2 - PPSV23 if available, else PCV20) 08/01/2020  ? DEXA SCAN  Never done  ? ? ?There are no preventive care reminders to display for this patient. ? ?Lab Results  ?Component Value Date  ? TSH 2.32 07/19/2017  ? ?Lab Results  ?Component Value Date  ? WBC 7.7 04/24/2019  ? HGB 14.0 04/24/2019  ? HCT 42.1 04/24/2019  ? MCV 92.9 04/24/2019  ? PLT  430.0 (H) 04/24/2019  ? ?Lab Results  ?Component Value Date  ? NA 136 01/11/2020  ? K 4.7 01/11/2020  ? CO2 23 01/11/2020  ? GLUCOSE 175 (H) 01/11/2020  ? BUN 21 01/11/2020  ? CREATININE 0.65 01/11/2020  ? BILITOT

## 2021-05-13 NOTE — Addendum Note (Signed)
Addended by: Nilda Riggs on: 05/13/2021 07:52 AM ? ? Modules accepted: Orders ? ?

## 2021-05-19 ENCOUNTER — Other Ambulatory Visit (INDEPENDENT_AMBULATORY_CARE_PROVIDER_SITE_OTHER): Payer: Managed Care, Other (non HMO)

## 2021-05-19 DIAGNOSIS — E7800 Pure hypercholesterolemia, unspecified: Secondary | ICD-10-CM

## 2021-05-19 DIAGNOSIS — I10 Essential (primary) hypertension: Secondary | ICD-10-CM | POA: Diagnosis not present

## 2021-05-19 LAB — HEPATIC FUNCTION PANEL
ALT: 17 U/L (ref 0–35)
AST: 23 U/L (ref 0–37)
Albumin: 4.2 g/dL (ref 3.5–5.2)
Alkaline Phosphatase: 70 U/L (ref 39–117)
Bilirubin, Direct: 0.1 mg/dL (ref 0.0–0.3)
Total Bilirubin: 0.5 mg/dL (ref 0.2–1.2)
Total Protein: 7.2 g/dL (ref 6.0–8.3)

## 2021-05-19 LAB — LIPID PANEL
Cholesterol: 164 mg/dL (ref 0–200)
HDL: 71 mg/dL (ref >39.00)
LDL Cholesterol: 72 mg/dL (ref 0–99)
NonHDL: 93.06
Total CHOL/HDL Ratio: 2
Triglycerides: 103 mg/dL (ref 0.0–149.0)
VLDL: 20.6 mg/dL (ref 0.0–40.0)

## 2021-05-19 LAB — BASIC METABOLIC PANEL
BUN: 21 mg/dL (ref 6–23)
CO2: 27 mEq/L (ref 19–32)
Calcium: 9.5 mg/dL (ref 8.4–10.5)
Chloride: 105 mEq/L (ref 96–112)
Creatinine, Ser: 0.61 mg/dL (ref 0.40–1.20)
GFR: 93.57 mL/min (ref >60.00)
Glucose, Bld: 107 mg/dL — ABNORMAL HIGH (ref 70–99)
Potassium: 4.8 mEq/L (ref 3.5–5.1)
Sodium: 140 mEq/L (ref 135–145)

## 2021-07-14 ENCOUNTER — Telehealth: Payer: Self-pay | Admitting: Family Medicine

## 2021-07-14 NOTE — Telephone Encounter (Signed)
Patient stopped by to drop off paperwork to be signed &/or completed by Dr. Elease Hashimoto. Pt signed and completed the "Forms to be filled out by Physicians" sheet. All paperwork was attached with a paperclip and placed in the Dr's folder.  (IC)

## 2021-07-15 NOTE — Telephone Encounter (Signed)
I spoke with the pt and she stated that if the forms are completed with just medical follow up then that is fine with her. Pt reported that her employer is more interested in the recent blood work and labs.

## 2021-07-15 NOTE — Telephone Encounter (Signed)
Pt informed of completion and placed in front office for pickup

## 2021-10-07 ENCOUNTER — Other Ambulatory Visit: Payer: Self-pay | Admitting: Family Medicine

## 2021-10-07 DIAGNOSIS — E119 Type 2 diabetes mellitus without complications: Secondary | ICD-10-CM

## 2021-12-03 ENCOUNTER — Other Ambulatory Visit: Payer: Self-pay | Admitting: Family Medicine

## 2021-12-03 DIAGNOSIS — J4540 Moderate persistent asthma, uncomplicated: Secondary | ICD-10-CM

## 2022-01-18 ENCOUNTER — Other Ambulatory Visit: Payer: Self-pay | Admitting: Family Medicine

## 2022-01-18 DIAGNOSIS — J4540 Moderate persistent asthma, uncomplicated: Secondary | ICD-10-CM

## 2022-02-10 ENCOUNTER — Other Ambulatory Visit: Payer: Self-pay | Admitting: Family Medicine

## 2022-02-10 DIAGNOSIS — Z1231 Encounter for screening mammogram for malignant neoplasm of breast: Secondary | ICD-10-CM

## 2022-03-08 ENCOUNTER — Other Ambulatory Visit: Payer: Self-pay | Admitting: Family Medicine

## 2022-03-08 DIAGNOSIS — E119 Type 2 diabetes mellitus without complications: Secondary | ICD-10-CM

## 2022-04-01 ENCOUNTER — Ambulatory Visit
Admission: RE | Admit: 2022-04-01 | Discharge: 2022-04-01 | Disposition: A | Discharge status: Home / Self Care | Payer: Managed Care, Other (non HMO) | Source: Ambulatory Visit | Attending: Family Medicine | Admitting: Family Medicine

## 2022-04-01 DIAGNOSIS — Z1231 Encounter for screening mammogram for malignant neoplasm of breast: Secondary | ICD-10-CM

## 2022-04-29 ENCOUNTER — Other Ambulatory Visit: Payer: Self-pay | Admitting: Family Medicine

## 2022-05-10 ENCOUNTER — Other Ambulatory Visit: Payer: Self-pay | Admitting: Family Medicine

## 2022-06-24 ENCOUNTER — Other Ambulatory Visit: Payer: Self-pay | Admitting: Family Medicine

## 2022-06-25 ENCOUNTER — Other Ambulatory Visit (HOSPITAL_COMMUNITY): Payer: Self-pay

## 2022-06-29 LAB — LAB REPORT - SCANNED
A1c: 7.6
HM Pap smear: ABNORMAL

## 2022-07-01 ENCOUNTER — Encounter: Payer: Self-pay | Admitting: Internal Medicine

## 2022-07-09 ENCOUNTER — Ambulatory Visit (AMBULATORY_SURGERY_CENTER): Payer: Managed Care, Other (non HMO)

## 2022-07-09 ENCOUNTER — Encounter: Payer: Self-pay | Admitting: Internal Medicine

## 2022-07-09 VITALS — Ht 66.0 in | Wt 225.0 lb

## 2022-07-09 DIAGNOSIS — Z1211 Encounter for screening for malignant neoplasm of colon: Secondary | ICD-10-CM

## 2022-07-09 MED ORDER — NA SULFATE-K SULFATE-MG SULF 17.5-3.13-1.6 GM/177ML PO SOLN: 1.0000 | Freq: Once | ORAL | 0 refills | Status: AC

## 2022-07-09 NOTE — Progress Notes (Signed)
Pre visit completed via phone call; Patient verified name, DOB, and address;  No egg or soy allergy known to patient;  No issues known to pt with past sedation with any surgeries or procedures; Patient denies ever being told they had issues or difficulty with intubation;  No FH of Malignant Hyperthermia; Pt is not on diet pills; Pt is not on home 02;  Pt is not on blood thinners;  Pt reports issues with constipation-patient reports her diet has changed since her mother passed and she is able to take OTC med to have relief   No A fib or A flutter; Have any cardiac testing pending--NO Pt instructed to use Singlecare.com or GoodRx for a price reduction on prep;   Insurance verified during PV appt=[employer]  Patient's chart reviewed by Cathlyn Parsons CNRA prior to previsit and patient appropriate for the [facility].  Previsit completed and red dot placed by patient's name on their procedure day (on provider's schedule).    Instructions sent to patient via MyChart per her request; Good Rx coupon info sent in RX;

## 2022-07-15 ENCOUNTER — Telehealth: Payer: Self-pay | Admitting: Family Medicine

## 2022-07-15 MED ORDER — PRAVASTATIN SODIUM 40 MG PO TABS: ORAL_TABLET | ORAL | 0 refills | Status: DC

## 2022-07-15 MED ORDER — BENAZEPRIL HCL 20 MG PO TABS: ORAL_TABLET | ORAL | 0 refills | Status: DC

## 2022-07-15 NOTE — Telephone Encounter (Signed)
Rx sent 

## 2022-07-15 NOTE — Addendum Note (Signed)
Addended by: Laneta Simmers L on: 07/15/2022 11:04 AM   Modules accepted: Orders

## 2022-07-15 NOTE — Telephone Encounter (Signed)
Prescription Request  07/15/2022  LOV: Visit date not found  What is the name of the medication or equipment?  pravastatin (PRAVACHOL) 40 MG tablet benazepril (LOTENSIN) 20 MG tablet  Have you contacted your pharmacy to request a refill? Yes   Which pharmacy would you like this sent to?   [id number] [place], [place] - [address] AT [facility] OF [facility] [address] [place] [address] Phone: [phone] Fax: [phone]    Patient notified that their request is being sent to the clinical staff for review and that they should receive a response within 2 business days.   Please advise at Mobile [phone] (mobile)

## 2022-07-18 ENCOUNTER — Encounter: Payer: Self-pay | Admitting: Certified Registered Nurse Anesthetist

## 2022-07-21 LAB — HM DIABETES EYE EXAM

## 2022-07-22 ENCOUNTER — Ambulatory Visit: Payer: Managed Care, Other (non HMO) | Admitting: Internal Medicine

## 2022-07-22 ENCOUNTER — Encounter: Payer: Self-pay | Admitting: Internal Medicine

## 2022-07-22 ENCOUNTER — Encounter: Payer: Self-pay | Admitting: Family Medicine

## 2022-07-22 VITALS — BP 105/69 | HR 80 | Temp 97.7°F | Resp 12 | Ht 66.0 in | Wt 225.0 lb

## 2022-07-22 DIAGNOSIS — K635 Polyp of colon: Secondary | ICD-10-CM | POA: Diagnosis not present

## 2022-07-22 DIAGNOSIS — Z1211 Encounter for screening for malignant neoplasm of colon: Secondary | ICD-10-CM

## 2022-07-22 DIAGNOSIS — D123 Benign neoplasm of transverse colon: Secondary | ICD-10-CM

## 2022-07-22 DIAGNOSIS — D120 Benign neoplasm of cecum: Secondary | ICD-10-CM | POA: Diagnosis not present

## 2022-07-22 MED ORDER — SODIUM CHLORIDE 0.9 % IV SOLN
500.0000 mL | INTRAVENOUS | Status: DC
Start: 2022-07-22 — End: 2022-07-22

## 2022-07-22 NOTE — Progress Notes (Signed)
Called to room to assist during endoscopic procedure.  Patient ID and intended procedure confirmed with present staff. Received instructions for my participation in the procedure from the performing physician.  

## 2022-07-22 NOTE — Progress Notes (Signed)
[facility] History and Physical   Primary Care Physician:  Kristian Covey, MD   Reason for Procedure:   CRCA screening  Plan:    colonoscopy     HPI: Sherry Gallagher is a 67 y.o. female for screening exam Last colonoscopy 2014    Past Medical History:  Diagnosis Date   Allergy    Asthma    seasonal - uses inhalers daily during fall   Diabetes mellitus    fasting 130s   GERD (gastroesophageal reflux disease)    Hyperlipidemia    on meds   Hypertension    on meds   Pneumonia    2 yrs   PONV (postoperative nausea and vomiting)    Snores     Past Surgical History:  Procedure Laterality Date   ABDOMINAL HYSTERECTOMY  1999   BREAST BIOPSY Left 11/10/2012   Procedure: BREAST BIOPSY WITH NEEDLE LOCALIZATION;  Surgeon: Emelia Loron, MD;  Location: [facility];  Service: General;  Laterality: Left;   BREAST SURGERY Bilateral    breast implants   COLONOSCOPY  2014   Dr. Medoff-miralax(exc)-normal   NASAL SINUS SURGERY  1980   RADIOACTIVE SEED GUIDED EXCISIONAL BREAST BIOPSY Right 01/16/2020   Procedure: RIGHT BREAST RADIOACTIVE SEED GUIDED EXCISIONAL BREAST BIOPSY;  Surgeon: Emelia Loron, MD;  Location: [facility];  Service: General;  Laterality: Right;   TOTAL HIP ARTHROPLASTY Left 2021    Prior to Admission medications   Medication Sig Start Date End Date Taking? Authorizing Provider  aspirin EC 81 MG tablet Take 81 mg by mouth every morning.   Yes [provider]  benazepril (LOTENSIN) 20 MG tablet TAKE 1 TABLET(20 MG) BY MOUTH DAILY 07/15/22  Yes Burchette, Elberta Fortis, MD  BIOTIN PO Take 1 tablet by mouth daily at 6 (six) AM.   Yes [provider]  cetirizine (ZYRTEC) 10 MG tablet Take 10 mg by mouth every other day.   Yes [provider]  COLLAGEN PO Take 1 capsule by mouth daily at 6 (six) AM.   Yes [provider]  esomeprazole (NEXIUM) 40 MG capsule Take 40 mg by mouth every other day.    Yes [provider]  fluticasone-salmeterol (ADVAIR DISKUS) 250-50 MCG/ACT AEPB INHALE 1 PUFF BY MOUTH DAILY AS NEEDED 01/18/22  Yes Burchette, Elberta Fortis, MD  metFORMIN (GLUCOPHAGE) 1000 MG tablet TAKE 1 TABLET BY MOUTH EVERY MORNING AND EVERY EVENING 03/08/22  Yes Burchette, Elberta Fortis, MD  Multiple Vitamin (MULTIVITAMIN) tablet Take 1 tablet by mouth daily.   Yes [provider]  Omega-3 Fatty Acids (EQL OMEGA 3 FISH OIL PO) Take 1 capsule by mouth daily at 6 (six) AM.   Yes [provider]  pravastatin (PRAVACHOL) 40 MG tablet TAKE 1 TABLET(40 MG) BY MOUTH DAILY 07/15/22  Yes Burchette, Elberta Fortis, MD  VITAMIN D, CHOLECALCIFEROL, PO Take 1 tablet by mouth every evening.   Yes [provider]  albuterol (VENTOLIN HFA) 108 (90 Base) MCG/ACT inhaler Inhale 2 puffs into the lungs every 6 (six) hours as needed for wheezing or shortness of breath. 07/29/20   Burchette, Elberta Fortis, MD  COMFORT LANCETS MISC Use as directed.  Ultra Soft Touch Lancets 01/27/16   Burchette, Elberta Fortis, MD  fluticasone (FLONASE) 50 MCG/ACT nasal spray Place 2 sprays into the nose as needed.     [provider]  glucose blood (ONE TOUCH ULTRA TEST) test strip Use to check blood sugar every day. DX: E11.9 07/29/20  Burchette, Elberta Fortis, MD  polyethylene glycol (MIRALAX / GLYCOLAX) 17 g packet Take 1 packet by mouth daily as needed for moderate constipation, mild constipation or severe constipation. 05/08/19   [provider]  TRULICITY 0.75 MG/0.5ML SOPN ADMINISTER 0.75 MG UNDER THE SKIN 1 TIME WEEKLY 06/24/22   Burchette, Elberta Fortis, MD    Current Outpatient Medications  Medication Sig Dispense Refill   aspirin EC 81 MG tablet Take 81 mg by mouth every morning.     benazepril (LOTENSIN) 20 MG tablet TAKE 1 TABLET(20 MG) BY MOUTH DAILY 30 tablet 0   BIOTIN PO Take 1 tablet by mouth daily at 6 (six) AM.     cetirizine (ZYRTEC) 10 MG tablet Take 10 mg by mouth every other day.     COLLAGEN PO  Take 1 capsule by mouth daily at 6 (six) AM.     esomeprazole (NEXIUM) 40 MG capsule Take 40 mg by mouth every other day.     fluticasone-salmeterol (ADVAIR DISKUS) 250-50 MCG/ACT AEPB INHALE 1 PUFF BY MOUTH DAILY AS NEEDED 60 each 1   metFORMIN (GLUCOPHAGE) 1000 MG tablet TAKE 1 TABLET BY MOUTH EVERY MORNING AND EVERY EVENING 180 tablet 0   Multiple Vitamin (MULTIVITAMIN) tablet Take 1 tablet by mouth daily.     Omega-3 Fatty Acids (EQL OMEGA 3 FISH OIL PO) Take 1 capsule by mouth daily at 6 (six) AM.     pravastatin (PRAVACHOL) 40 MG tablet TAKE 1 TABLET(40 MG) BY MOUTH DAILY 30 tablet 0   VITAMIN D, CHOLECALCIFEROL, PO Take 1 tablet by mouth every evening.     albuterol (VENTOLIN HFA) 108 (90 Base) MCG/ACT inhaler Inhale 2 puffs into the lungs every 6 (six) hours as needed for wheezing or shortness of breath. 8 g 1   COMFORT LANCETS MISC Use as directed.  Ultra Soft Touch Lancets 100 each 11   fluticasone (FLONASE) 50 MCG/ACT nasal spray Place 2 sprays into the nose as needed.      glucose blood (ONE TOUCH ULTRA TEST) test strip Use to check blood sugar every day. DX: E11.9 100 each 2   polyethylene glycol (MIRALAX / GLYCOLAX) 17 g packet Take 1 packet by mouth daily as needed for moderate constipation, mild constipation or severe constipation.     TRULICITY 0.75 MG/0.5ML SOPN ADMINISTER 0.75 MG UNDER THE SKIN 1 TIME WEEKLY 4 mL 0   Current Facility-Administered Medications  Medication Dose Route Frequency Provider Last Rate Last Admin   0.9 %  sodium chloride infusion  500 mL Intravenous Continuous Iva Boop, MD        Allergies as of 07/22/2022 - Review Complete 07/22/2022  Allergen Reaction Noted   Penicillin g Hives, Itching, Swelling, and Rash 09/04/2019   Penicillins Hives, Itching, Swelling, and Rash 10/14/2010    Family History  Problem Relation Age of Onset   Throat cancer Mother 67       smoker/ETOH   Alcohol abuse Father    AAA (abdominal aortic aneurysm) Father 38    Cancer Maternal Grandmother        breast   Breast cancer Maternal Grandmother    Cancer Paternal Grandmother        breast   Breast cancer Paternal Grandmother    Colon polyps Neg Hx    Colon cancer Neg Hx    Esophageal cancer Neg Hx    Rectal cancer Neg Hx    Stomach cancer Neg Hx     Social History  Socioeconomic History   Marital status: Married    Spouse name: Not on file   Number of children: Not on file   Years of education: Not on file   Highest education level: Bachelor's degree (e.g., BA, AB, BS)  Occupational History   Not on file  Tobacco Use   Smoking status: Never   Smokeless tobacco: Never  Vaping Use   Vaping Use: Never used  Substance and Sexual Activity   Alcohol use: Not Currently    Comment: social   Drug use: No   Sexual activity: Yes    Birth control/protection: Surgical, Post-menopausal  Other Topics Concern   Not on file  Social History Narrative   Not on file   Social Determinants of Health   Financial Resource Strain: Low Risk  (05/11/2021)   Overall Financial Resource Strain (CARDIA)    Difficulty of Paying Living Expenses: Not hard at all  Food Insecurity: No Food Insecurity (05/11/2021)   Hunger Vital Sign    Worried About Running Out of Food in the Last Year: Never true    Ran Out of Food in the Last Year: Never true  Transportation Needs: No Transportation Needs (05/11/2021)   PRAPARE - Administrator, Civil Service (Medical): No    Lack of Transportation (Non-Medical): No  Physical Activity: Sufficiently Active (05/11/2021)   Exercise Vital Sign    Days of Exercise per Week: 7 days    Minutes of Exercise per Session: 30 min  Stress: Stress Concern Present (05/11/2021)   [employer] of Occupational Health - Occupational Stress Questionnaire    Feeling of Stress : To some extent  Social Connections: Socially Integrated (05/11/2021)   Social Connection and Isolation Panel [NHANES]    Frequency of Communication with  Friends and Family: More than three times a week    Frequency of Social Gatherings with Friends and Family: More than three times a week    Attends Religious Services: More than 4 times per year    Active Member of [employer] or Organizations: Yes    Attends Banker Meetings: 1 to 4 times per year    Marital Status: Married  Catering manager Violence: Not on file    Review of Systems:  All other review of systems negative except as mentioned in the HPI.  Physical Exam: Vital signs BP (!) 142/70   Pulse 98   Temp 97.7 F (36.5 C)   Ht 5\' 6"  (1.676 m)   Wt 225 lb (102.1 kg)   SpO2 94%   BMI 36.32 kg/m   General:   Alert,  Well-developed, well-nourished, pleasant and cooperative in NAD Lungs:  Clear throughout to auscultation.   Heart:  Regular rate and rhythm; no murmurs, clicks, rubs,  or gallops. Abdomen:  Soft, nontender and nondistended. Normal bowel sounds.   Neuro/Psych:  Alert and cooperative. Normal mood and affect. A and O x 3   @Tayten Bergdoll  Sena Slate, MD, [facility] [phone] (pager) 07/22/2022 10:28 AM@

## 2022-07-22 NOTE — Progress Notes (Signed)
Report given to PACU, vss 

## 2022-07-22 NOTE — Op Note (Signed)
[facility] Patient Name: Sherry Gallagher Procedure Date: 07/22/2022 10:32 AM MRN: [id number] Endoscopist: Iva Boop , MD, [id number] Age: 67 Referring MD:  Date of Birth: [birth year] Gender: Female Account #: [id number] Procedure:                Colonoscopy Indications:              Screening for colorectal malignant neoplasm, Last                            colonoscopy: 2014 Medicines:                Monitored Anesthesia Care Procedure:                Pre-Anesthesia Assessment:                           - Prior to the procedure, a History and Physical                            was performed, and patient medications and                            allergies were reviewed. The patient's tolerance of                            previous anesthesia was also reviewed. The risks                            and benefits of the procedure and the sedation                            options and risks were discussed with the patient.                            All questions were answered, and informed consent                            was obtained. Prior Anticoagulants: The patient has                            taken no anticoagulant or antiplatelet agents. ASA                            Grade Assessment: II - A patient with mild systemic                            disease. After reviewing the risks and benefits,                            the patient was deemed in satisfactory condition to                            undergo the procedure.  After obtaining informed consent, the colonoscope                            was passed under direct vision. Throughout the                            procedure, the patient's blood pressure, pulse, and                            oxygen saturations were monitored continuously. The                            Olympus SN [id number] was introduced through the anus                            and advanced to the the cecum,  identified by                            appendiceal orifice and ileocecal valve. The                            colonoscopy was performed without difficulty. The                            patient tolerated the procedure well. The quality                            of the bowel preparation was good. The ileocecal                            valve, appendiceal orifice, and rectum were                            photographed. The bowel preparation used was SUPREP                            via split dose instruction. Scope In: 10:39:09 AM Scope Out: 10:54:26 AM Scope Withdrawal Time: 0 hours 10 minutes 48 seconds  Total Procedure Duration: 0 hours 15 minutes 17 seconds  Findings:                 The perianal and digital rectal examinations were                            normal.                           Two sessile polyps were found in the transverse                            colon and cecum. The polyps were diminutive in                            size. These polyps were removed with a cold snare.  Resection and retrieval were complete. Verification                            of patient identification for the specimen was                            done. Estimated blood loss was minimal.                           The exam was otherwise without abnormality on                            direct and retroflexion views. Complications:            No immediate complications. Estimated Blood Loss:     Estimated blood loss was minimal. Impression:               - Two diminutive polyps in the transverse colon and                            in the cecum, removed with a cold snare. Resected                            and retrieved.                           - The examination was otherwise normal on direct                            and retroflexion views. Recommendation:           - Patient has a contact number available for                            emergencies. The  signs and symptoms of potential                            delayed complications were discussed with the                            patient. Return to normal activities tomorrow.                            Written discharge instructions were provided to the                            patient.                           - Resume previous diet.                           - Continue present medications.                           - Repeat colonoscopy is recommended. The  colonoscopy date will be determined after pathology                            results from today's exam become available for                            review. Iva Boop, MD 07/22/2022 11:04:53 AM This report has been signed electronically.

## 2022-07-22 NOTE — Patient Instructions (Addendum)
I found and removed two tiny polyps that look benign.  All else ok - normal.  I will let you know pathology results and when to have another routine colonoscopy by mail and/or My Chart.  I appreciate the opportunity to care for you. Iva Boop, MD, [facility]  Handouts Provided:  Polyps  YOU HAD AN ENDOSCOPIC PROCEDURE TODAY AT THE [facility]:   Refer to the procedure report that was given to you for any specific questions about what was found during the examination.  If the procedure report does not answer your questions, please call your gastroenterologist to clarify.  If you requested that your care partner not be given the details of your procedure findings, then the procedure report has been included in a sealed envelope for you to review at your convenience later.  YOU SHOULD EXPECT: Some feelings of bloating in the abdomen. Passage of more gas than usual.  Walking can help get rid of the air that was put into your GI tract during the procedure and reduce the bloating. If you had a lower endoscopy (such as a colonoscopy or flexible sigmoidoscopy) you may notice spotting of blood in your stool or on the toilet paper. If you underwent a bowel prep for your procedure, you may not have a normal bowel movement for a few days.  Please Note:  You might notice some irritation and congestion in your nose or some drainage.  This is from the oxygen used during your procedure.  There is no need for concern and it should clear up in a day or so.  SYMPTOMS TO REPORT IMMEDIATELY:  Following lower endoscopy (colonoscopy or flexible sigmoidoscopy):  Excessive amounts of blood in the stool  Significant tenderness or worsening of abdominal pains  Swelling of the abdomen that is new, acute  Fever of 100F or higher  For urgent or emergent issues, a gastroenterologist can be reached at any hour by calling [phone]. Do not use MyChart messaging for urgent concerns.    DIET:  We do  recommend a small meal at first, but then you may proceed to your regular diet.  Drink plenty of fluids but you should avoid alcoholic beverages for 24 hours.  ACTIVITY:  You should plan to take it easy for the rest of today and you should NOT DRIVE or use heavy machinery until tomorrow (because of the sedation medicines used during the test).    FOLLOW UP: Our staff will call the number listed on your records the next business day following your procedure.  We will call around 7:15- 8:00 am to check on you and address any questions or concerns that you may have regarding the information given to you following your procedure. If we do not reach you, we will leave a message.     If any biopsies were taken you will be contacted by phone or by letter within the next 1-3 weeks.  Please call us at [phone] if you have not heard about the biopsies in 3 weeks.    SIGNATURES/CONFIDENTIALITY: You and/or your care partner have signed paperwork which will be entered into your electronic medical record.  These signatures attest to the fact that that the information above on your After Visit Summary has been reviewed and is understood.  Full responsibility of the confidentiality of this discharge information lies with you and/or your care-partner.

## 2022-07-23 ENCOUNTER — Telehealth: Payer: Self-pay | Admitting: *Deleted

## 2022-07-23 NOTE — Telephone Encounter (Signed)
  Follow up Call-     07/22/2022   10:11 AM  Call back number  Post procedure Call Back phone  # [phone]  Permission to leave phone message Yes     Patient questions:  Do you have a fever, pain , or abdominal swelling? No. Pain Score  0 *  Have you tolerated food without any problems? Yes.    Have you been able to return to your normal activities? Yes.    Do you have any questions about your discharge instructions: Diet   No. Medications  No. Follow up visit  No.  Do you have questions or concerns about your Care? No.  Actions: * If pain score is 4 or above: No action needed, pain <4.

## 2022-08-05 ENCOUNTER — Encounter: Payer: Self-pay | Admitting: Internal Medicine

## 2022-08-05 DIAGNOSIS — Z86010 Personal history of colonic polyps: Secondary | ICD-10-CM

## 2022-08-05 DIAGNOSIS — Z860101 Personal history of adenomatous and serrated colon polyps: Secondary | ICD-10-CM

## 2022-08-05 HISTORY — DX: Personal history of adenomatous and serrated colon polyps: Z86.0101

## 2022-08-10 ENCOUNTER — Telehealth: Payer: Self-pay | Admitting: Family Medicine

## 2022-08-10 NOTE — Telephone Encounter (Signed)
Pt came in person and dropped off copy of lab results. Placed in providers folder in front office.

## 2022-08-10 NOTE — Telephone Encounter (Signed)
I spoke with the patient and requested she contact [facility] to have them fax over results for review.

## 2022-08-10 NOTE — Telephone Encounter (Signed)
Patient went to [facility] and her A1C was at 7.4, would like to know if she should increase her Trulicity.  Please advise.

## 2022-08-11 MED ORDER — TRULICITY 1.5 MG/0.5ML ~~LOC~~ SOAJ: 1.5000 mg | SUBCUTANEOUS | 0 refills | Status: DC

## 2022-08-11 NOTE — Telephone Encounter (Signed)
Patient informed of message below and rx sent

## 2022-09-11 ENCOUNTER — Other Ambulatory Visit: Payer: Self-pay | Admitting: Family Medicine

## 2022-09-15 ENCOUNTER — Telehealth: Payer: Managed Care, Other (non HMO) | Admitting: Family Medicine

## 2022-09-15 VITALS — Ht 66.0 in | Wt 225.0 lb

## 2022-09-15 DIAGNOSIS — E7800 Pure hypercholesterolemia, unspecified: Secondary | ICD-10-CM

## 2022-09-15 DIAGNOSIS — E1165 Type 2 diabetes mellitus with hyperglycemia: Secondary | ICD-10-CM

## 2022-09-15 DIAGNOSIS — E669 Obesity, unspecified: Secondary | ICD-10-CM | POA: Diagnosis not present

## 2022-09-15 DIAGNOSIS — Z7985 Long-term (current) use of injectable non-insulin antidiabetic drugs: Secondary | ICD-10-CM | POA: Diagnosis not present

## 2022-09-15 DIAGNOSIS — I10 Essential (primary) hypertension: Secondary | ICD-10-CM

## 2022-09-15 MED ORDER — PRAVASTATIN SODIUM 40 MG PO TABS: ORAL_TABLET | ORAL | 0 refills | Status: DC

## 2022-09-15 MED ORDER — BENAZEPRIL HCL 20 MG PO TABS: ORAL_TABLET | ORAL | 0 refills | Status: DC

## 2022-09-15 MED ORDER — TIRZEPATIDE 2.5 MG/0.5ML ~~LOC~~ SOAJ: 2.5000 mg | SUBCUTANEOUS | 0 refills | Status: DC

## 2022-09-15 NOTE — Progress Notes (Signed)
Patient ID: Sherry Gallagher, female   DOB: 1955/10/29, 67 y.o.   MRN: [id number]   Virtual Visit via Video Note  I connected with Tenna Delaine on 09/15/22 at  5:00 PM EDT by a video enabled telemedicine application and verified that I am speaking with the correct person using two identifiers.  Location patient: home Location provider:work or home office Persons participating in the virtual visit: patient, provider  I discussed the limitations of evaluation and management by telemedicine and the availability of in person appointments. The patient expressed understanding and agreed to proceed.   HPI:  Sherry Gallagher is needing refills of several medications.  She has history of type 2 diabetes, hypertension, hyperlipidemia.  She is needing refills of benazepril and pravastatin.  She had recently called and we had titrated her Trulicity from 0.75 mg to 1.5 mg.  She states she has had significant weight gain over the past year.  She recently went to GYN and had A1c of 7.4% which she attributes to the weight gain.  She has recently started going back to the pool for some exercises.  She has had only really modest weight loss with the Trulicity.  She would be amenable to other potential medication options.  She has had no intolerance with Trulicity.  Her medications are reviewed and include pravastatin 40 mg daily, metformin 1000 mg twice daily, benazepril 20 mg daily, and Trulicity as above.  ROS: See pertinent positives and negatives per HPI.  Past Medical History:  Diagnosis Date   Allergy    Asthma    seasonal - uses inhalers daily during fall   Diabetes mellitus    fasting 130s   GERD (gastroesophageal reflux disease)    Hx of adenomatous and prixmal hyperplastic polyp of colon 08/05/2022   Hyperlipidemia    on meds   Hypertension    on meds   Pneumonia    2 yrs   PONV (postoperative nausea and vomiting)    Snores     Past Surgical History:  Procedure Laterality Date    ABDOMINAL HYSTERECTOMY  1999   BREAST BIOPSY Left 11/10/2012   Procedure: BREAST BIOPSY WITH NEEDLE LOCALIZATION;  Surgeon: Emelia Loron, MD;  Location: [facility] OR;  Service: General;  Laterality: Left;   BREAST SURGERY Bilateral    breast implants   COLONOSCOPY  2014   Dr. Medoff-miralax(exc)-normal   NASAL SINUS SURGERY  1980   RADIOACTIVE SEED GUIDED EXCISIONAL BREAST BIOPSY Right 01/16/2020   Procedure: RIGHT BREAST RADIOACTIVE SEED GUIDED EXCISIONAL BREAST BIOPSY;  Surgeon: Emelia Loron, MD;  Location: [facility];  Service: General;  Laterality: Right;   TOTAL HIP ARTHROPLASTY Left 2021    Family History  Problem Relation Age of Onset   Throat cancer Mother 52       smoker/ETOH   Alcohol abuse Father    AAA (abdominal aortic aneurysm) Father 21   Cancer Maternal Grandmother        breast   Breast cancer Maternal Grandmother    Cancer Paternal Grandmother        breast   Breast cancer Paternal Grandmother    Colon polyps Neg Hx    Colon cancer Neg Hx    Esophageal cancer Neg Hx    Rectal cancer Neg Hx    Stomach cancer Neg Hx     SOCIAL HX: Non-smoker   Current Outpatient Medications:    albuterol (VENTOLIN HFA) 108 (90 Base) MCG/ACT inhaler, Inhale 2 puffs into the lungs every  6 (six) hours as needed for wheezing or shortness of breath., Disp: 8 g, Rfl: 1   aspirin EC 81 MG tablet, Take 81 mg by mouth every morning., Disp: , Rfl:    BIOTIN PO, Take 1 tablet by mouth daily at 6 (six) AM., Disp: , Rfl:    cetirizine (ZYRTEC) 10 MG tablet, Take 10 mg by mouth every other day., Disp: , Rfl:    COLLAGEN PO, Take 1 capsule by mouth daily at 6 (six) AM., Disp: , Rfl:    COMFORT LANCETS MISC, Use as directed.  Ultra Soft Touch Lancets, Disp: 100 each, Rfl: 11   esomeprazole (NEXIUM) 40 MG capsule, Take 40 mg by mouth every other day., Disp: , Rfl:    fluticasone (FLONASE) 50 MCG/ACT nasal spray, Place 2 sprays into the nose as needed. , Disp: , Rfl:     fluticasone-salmeterol (ADVAIR DISKUS) 250-50 MCG/ACT AEPB, INHALE 1 PUFF BY MOUTH DAILY AS NEEDED, Disp: 60 each, Rfl: 1   glucose blood (ONE TOUCH ULTRA TEST) test strip, Use to check blood sugar every day. DX: E11.9, Disp: 100 each, Rfl: 2   metFORMIN (GLUCOPHAGE) 1000 MG tablet, TAKE 1 TABLET BY MOUTH EVERY MORNING AND EVERY EVENING, Disp: 180 tablet, Rfl: 0   Multiple Vitamin (MULTIVITAMIN) tablet, Take 1 tablet by mouth daily., Disp: , Rfl:    Omega-3 Fatty Acids (EQL OMEGA 3 FISH OIL PO), Take 1 capsule by mouth daily at 6 (six) AM., Disp: , Rfl:    polyethylene glycol (MIRALAX / GLYCOLAX) 17 g packet, Take 1 packet by mouth daily as needed for moderate constipation, mild constipation or severe constipation., Disp: , Rfl:    tirzepatide (MOUNJARO) 2.5 MG/0.5ML Pen, Inject 2.5 mg into the skin once a week., Disp: 2 mL, Rfl: 0   VITAMIN D, CHOLECALCIFEROL, PO, Take 1 tablet by mouth every evening., Disp: , Rfl:    benazepril (LOTENSIN) 20 MG tablet, TAKE 1 TABLET(20 MG) BY MOUTH DAILY, Disp: 30 tablet, Rfl: 0   pravastatin (PRAVACHOL) 40 MG tablet, TAKE 1 TABLET(40 MG) BY MOUTH DAILY, Disp: 30 tablet, Rfl: 0  EXAM:  VITALS per patient if applicable:  GENERAL: alert, oriented, appears well and in no acute distress  HEENT: atraumatic, conjunttiva clear, no obvious abnormalities on inspection of external nose and ears  NECK: normal movements of the head and neck  LUNGS: on inspection no signs of respiratory distress, breathing rate appears normal, no obvious gross SOB, gasping or wheezing  CV: no obvious cyanosis  MS: moves all visible extremities without noticeable abnormality  PSYCH/NEURO: pleasant and cooperative, no obvious depression or anxiety, speech and thought processing grossly intact  ASSESSMENT AND PLAN:  Discussed the following assessment and plan:  #1 type 2 diabetes with recent A1c suboptimally controlled 7.4%.  Recent weight gain.  She is currently on Trulicity.  We  did discuss possible transition to [facility] if we get this covered.  Will have her stop the Trulicity after this week and start Mounjaro 2.5 mg subcutaneous once weekly.  After 1 month our plan is to titrate up to 5 mg once weekly and set up office follow-up in 3 months to reassess A1c  #2 hypertension.  Patient on benazepril.  Refill benazepril for 1 year.  #3 hyperlipidemia treated with pravastatin.  Patient overdue for labs.  Refill pravastatin.  Set up office follow-up in recheck lipids and chemistries including hepatic function at that time     I discussed the assessment and treatment plan with  the patient. The patient was provided an opportunity to ask questions and all were answered. The patient agreed with the plan and demonstrated an understanding of the instructions.   The patient was advised to call back or seek an in-person evaluation if the symptoms worsen or if the condition fails to improve as anticipated.     Evelena Peat, MD

## 2022-10-10 ENCOUNTER — Other Ambulatory Visit: Payer: Self-pay | Admitting: Family Medicine

## 2022-10-13 ENCOUNTER — Other Ambulatory Visit: Payer: Self-pay | Admitting: Family Medicine

## 2022-10-13 ENCOUNTER — Telehealth: Payer: Self-pay | Admitting: Family Medicine

## 2022-10-13 NOTE — Telephone Encounter (Signed)
I spoke with the patient and she reported she is tolerating current dosage well and is not having any problems.

## 2022-10-13 NOTE — Telephone Encounter (Signed)
Pt would like to know if MD thinks it is time to increase the dosage of her tirzepatide tirzepatide [facility]) 2.5 MG/0.5ML Pen?  Please call Pt back to discuss.

## 2022-10-14 MED ORDER — PRAVASTATIN SODIUM 40 MG PO TABS: ORAL_TABLET | ORAL | 1 refills | Status: DC

## 2022-10-14 MED ORDER — TIRZEPATIDE 5 MG/0.5ML ~~LOC~~ SOAJ: 5.0000 mg | SUBCUTANEOUS | 0 refills | Status: DC

## 2022-10-14 NOTE — Telephone Encounter (Signed)
Left a message for the patient to return my call.  

## 2022-10-14 NOTE — Telephone Encounter (Signed)
Patient called back and was informed of the message below.  Patient is aware the Rx was sent to [facility] and a follow up appt was scheduled for 11/8.  Patient also requested a refill on Pravastatin and this was sent to [facility] also.

## 2022-11-12 ENCOUNTER — Telehealth: Payer: Self-pay | Admitting: Family Medicine

## 2022-11-12 NOTE — Telephone Encounter (Addendum)
Prescription Request  11/12/2022  LOV:  05/13/2021  What is the name of the medication or equipment? tirzepatide tirzepatide [facility]) 5 MG/0.5ML Pen  *Pt called to inform MD that she tolerated the 5 mg very well and would like to increase dosage to 7.5 mg.  Have you contacted your pharmacy to request a refill? No   Which pharmacy would you like this sent to?   [id number] [place], [place] - [address] AT [facility] [address] & [facility] [address] [place] [address] Phone: [phone] Fax: [phone]    Patient notified that their request is being sent to the clinical staff for review and that they should receive a response within 2 business days.   Please advise at Mobile [phone] (mobile)

## 2022-11-14 ENCOUNTER — Other Ambulatory Visit: Payer: Self-pay | Admitting: Family Medicine

## 2022-11-15 MED ORDER — TIRZEPATIDE 7.5 MG/0.5ML ~~LOC~~ SOAJ: 7.5000 mg | SUBCUTANEOUS | 0 refills | Status: DC

## 2022-11-15 NOTE — Telephone Encounter (Signed)
Rx done and the patient has an appt previously scheduled for 11/8.

## 2022-11-17 ENCOUNTER — Encounter: Payer: Self-pay | Admitting: Family Medicine

## 2022-11-17 NOTE — Telephone Encounter (Signed)
Error/njr °

## 2022-11-17 NOTE — Telephone Encounter (Signed)
Pt states her next shot is due tomorrow morning

## 2022-11-17 NOTE — Telephone Encounter (Signed)
Pt calling, pharmacy says PA is needed for [facility]

## 2022-11-18 ENCOUNTER — Other Ambulatory Visit (HOSPITAL_COMMUNITY): Payer: Self-pay

## 2022-11-18 NOTE — Telephone Encounter (Addendum)
Pt need PA for mounjaro 7.5 mg. Pt pick up 5.0 mg this month

## 2022-11-19 ENCOUNTER — Telehealth: Payer: Self-pay

## 2022-11-19 ENCOUNTER — Other Ambulatory Visit (HOSPITAL_COMMUNITY): Payer: Self-pay

## 2022-11-19 NOTE — Telephone Encounter (Signed)
Noted  

## 2022-11-19 NOTE — Telephone Encounter (Addendum)
Pharmacy Patient Advocate Encounter   Received notification from Pt Calls Messages that prior authorization for Mounjaro 7.5MG  is required/requested.   Insurance verification completed.   The patient is insured through [employer] .   Per test claim: PA required; PA submitted to [employer] via Phone Key/confirmation #/[id number] Status is pending Turnaround time is about 5 days

## 2022-11-19 NOTE — Telephone Encounter (Signed)
I spoke with Luisa Hart at [facility] and he was informed of approval.

## 2022-11-19 NOTE — Telephone Encounter (Signed)
PA request has been Submitted. New Encounter created for follow up. For additional info see Pharmacy Prior Auth telephone encounter from 11/19/22.

## 2022-11-19 NOTE — Telephone Encounter (Signed)
Pharmacy Patient Advocate Encounter  Received notification from [employer] that Prior Authorization for [facility] 7.5MG   has been APPROVED from 11/19/22 to 11/19/23. Ran test claim, Copay is $75 (3 month supply). This test claim was processed through [facility]- copay amounts may vary at other pharmacies due to pharmacy/plan contracts, or as the patient moves through the different stages of their insurance plan.   PA #/Case ID/Reference #: [id number]

## 2022-12-12 ENCOUNTER — Other Ambulatory Visit: Payer: Self-pay | Admitting: Family Medicine

## 2022-12-17 ENCOUNTER — Ambulatory Visit: Payer: Managed Care, Other (non HMO) | Admitting: Family Medicine

## 2022-12-17 ENCOUNTER — Encounter: Payer: Self-pay | Admitting: Family Medicine

## 2022-12-17 VITALS — BP 142/70 | HR 95 | Temp 97.6°F | Ht 66.0 in | Wt 219.4 lb

## 2022-12-17 DIAGNOSIS — E7800 Pure hypercholesterolemia, unspecified: Secondary | ICD-10-CM | POA: Diagnosis not present

## 2022-12-17 DIAGNOSIS — Z7984 Long term (current) use of oral hypoglycemic drugs: Secondary | ICD-10-CM | POA: Diagnosis not present

## 2022-12-17 DIAGNOSIS — I10 Essential (primary) hypertension: Secondary | ICD-10-CM

## 2022-12-17 DIAGNOSIS — E119 Type 2 diabetes mellitus without complications: Secondary | ICD-10-CM | POA: Diagnosis not present

## 2022-12-17 LAB — HEMOGLOBIN A1C: Hgb A1c MFr Bld: 7.8 % — ABNORMAL HIGH (ref 4.6–6.5)

## 2022-12-17 LAB — LIPID PANEL
Cholesterol: 167 mg/dL (ref 0–200)
HDL: 60.6 mg/dL (ref >39.00)
LDL Cholesterol: 84 mg/dL (ref 0–99)
NonHDL: 106.36
Total CHOL/HDL Ratio: 3
Triglycerides: 113 mg/dL (ref 0.0–149.0)
VLDL: 22.6 mg/dL (ref 0.0–40.0)

## 2022-12-17 LAB — COMPREHENSIVE METABOLIC PANEL
ALT: 17 U/L (ref 0–35)
AST: 17 U/L (ref 0–37)
Albumin: 4.4 g/dL (ref 3.5–5.2)
Alkaline Phosphatase: 82 U/L (ref 39–117)
BUN: 16 mg/dL (ref 6–23)
CO2: 28 meq/L (ref 19–32)
Calcium: 10.2 mg/dL (ref 8.4–10.5)
Chloride: 102 meq/L (ref 96–112)
Creatinine, Ser: 0.69 mg/dL (ref 0.40–1.20)
GFR: 89.83 mL/min (ref >60.00)
Glucose, Bld: 147 mg/dL — ABNORMAL HIGH (ref 70–99)
Potassium: 4.2 meq/L (ref 3.5–5.1)
Sodium: 138 meq/L (ref 135–145)
Total Bilirubin: 0.5 mg/dL (ref 0.2–1.2)
Total Protein: 7.6 g/dL (ref 6.0–8.3)

## 2022-12-17 LAB — MICROALBUMIN / CREATININE URINE RATIO
Creatinine,U: 52.9 mg/dL
Microalb Creat Ratio: 1.4 mg/g (ref 0.0–30.0)
Microalb, Ur: 0.8 mg/dL (ref 0.0–1.9)

## 2022-12-17 MED ORDER — METFORMIN HCL 1000 MG PO TABS: ORAL_TABLET | ORAL | 3 refills | Status: DC

## 2022-12-17 MED ORDER — ALBUTEROL SULFATE HFA 108 (90 BASE) MCG/ACT IN AERS: 2.0000 | INHALATION_SPRAY | Freq: Four times a day (QID) | RESPIRATORY_TRACT | 1 refills | Status: AC | PRN

## 2022-12-17 MED ORDER — BENAZEPRIL HCL 20 MG PO TABS: ORAL_TABLET | ORAL | 3 refills | Status: DC

## 2022-12-17 MED ORDER — PRAVASTATIN SODIUM 40 MG PO TABS: ORAL_TABLET | ORAL | 3 refills | Status: DC

## 2022-12-17 NOTE — Progress Notes (Signed)
Established Patient Office Visit  Subjective   Patient ID: Sherry Gallagher, female    DOB: September 26, 1955  Age: 67 y.o. MRN: [id number]  Chief Complaint  Patient presents with   Medical Management of Chronic Issues    HPI   Ms. Brozowski is here for medical follow-up.  She has history of hypertension, asthma, type 2 diabetes, gout, hyperlipidemia.  She remains on Advair and asthma fairly well-controlled.  Overdue for labs though apparently because she got some from GYN including A1c several months ago during the summer.  She thinks her A1c was around 7.4.  We made a transition from [facility] to [facility] and she is currently on 7.5 mg subcutaneous once weekly.  Has only lost about 3 pounds thus far.  She has had flu vaccine.  She is getting diabetic eye exams.  Denies any neuropathy symptoms.  No visual changes.  No chest pains.  These refills of several medications.  She remains on pravastatin for hyperlipidemia.  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  She hopes to work at least couple more years.\  The 10-year ASCVD risk score (Arnett DK, et al., 2019) is: 17.6%   Values used to calculate the score:     Age: 62 years     Sex: Female     Is Non-Hispanic African American: No     Diabetic: Yes     Tobacco smoker: No     Systolic Blood Pressure: 142 mmHg     Is BP treated: Yes     HDL Cholesterol: 71 mg/dL     Total Cholesterol: 164 mg/dL   Past Medical History:  Diagnosis Date   Allergy    Asthma    seasonal - uses inhalers daily during fall   Diabetes mellitus    fasting 130s   GERD (gastroesophageal reflux disease)    Hx of adenomatous and prixmal hyperplastic polyp of colon 08/05/2022   Hyperlipidemia    on meds   Hypertension    on meds   Pneumonia    2 yrs   PONV (postoperative nausea and vomiting)    Snores    Past Surgical History:  Procedure Laterality Date   ABDOMINAL HYSTERECTOMY  1999   BREAST BIOPSY Left 11/10/2012   Procedure:  BREAST BIOPSY WITH NEEDLE LOCALIZATION;  Surgeon: Emelia Loron, MD;  Location: [facility] OR;  Service: General;  Laterality: Left;   BREAST SURGERY Bilateral    breast implants   COLONOSCOPY  2014   Dr. Medoff-miralax(exc)-normal   NASAL SINUS SURGERY  1980   RADIOACTIVE SEED GUIDED EXCISIONAL BREAST BIOPSY Right 01/16/2020   Procedure: RIGHT BREAST RADIOACTIVE SEED GUIDED EXCISIONAL BREAST BIOPSY;  Surgeon: Emelia Loron, MD;  Location: [facility];  Service: General;  Laterality: Right;   TOTAL HIP ARTHROPLASTY Left 2021    reports that she has never smoked. She has never used smokeless tobacco. She reports that she does not currently use alcohol. She reports that she does not use drugs. family history includes AAA (abdominal aortic aneurysm) (age of onset: 63) in her father; Alcohol abuse in her father; Breast cancer in her maternal grandmother and paternal grandmother; Cancer in her maternal grandmother and paternal grandmother; Throat cancer (age of onset: 65) in her mother. Allergies  Allergen Reactions   Penicillin G Hives, Itching, Swelling and Rash   Penicillins Hives, Itching, Swelling and Rash    Review of Systems  Constitutional:  Negative for malaise/fatigue.  Eyes:  Negative for blurred vision.  Respiratory:  Negative for shortness of breath.   Cardiovascular:  Negative for chest pain.  Neurological:  Negative for dizziness, weakness and headaches.      Objective:     BP (!) 142/70 (BP Location: Left Arm, Patient Position: Sitting, Cuff Size: Normal)   Pulse 95   Temp 97.6 F (36.4 C) (Oral)   Ht 5\' 6"  (1.676 m)   Wt 219 lb 6.4 oz (99.5 kg)   SpO2 98%   BMI 35.41 kg/m  BP Readings from Last 3 Encounters:  12/17/22 (!) 142/70  07/22/22 105/69  05/13/21 136/80   Wt Readings from Last 3 Encounters:  12/17/22 219 lb 6.4 oz (99.5 kg)  09/15/22 225 lb (102.1 kg)  07/22/22 225 lb (102.1 kg)      Physical Exam Vitals reviewed.   Constitutional:      Appearance: She is well-developed.  Eyes:     Pupils: Pupils are equal, round, and reactive to light.  Neck:     Thyroid: No thyromegaly.     Vascular: No JVD.  Cardiovascular:     Rate and Rhythm: Normal rate and regular rhythm.     Heart sounds:     No gallop.  Pulmonary:     Effort: Pulmonary effort is normal. No respiratory distress.     Breath sounds: Normal breath sounds. No wheezing or rales.  Musculoskeletal:     Cervical back: Neck supple.  Neurological:     Mental Status: She is alert.      No results found for any visits on 12/17/22.  Last CBC Lab Results  Component Value Date   WBC 7.7 04/24/2019   HGB 14.0 04/24/2019   HCT 42.1 04/24/2019   MCV 92.9 04/24/2019   MCH 31.7 11/08/2012   RDW 13.3 04/24/2019   PLT 430.0 (H) 04/24/2019   Last metabolic panel Lab Results  Component Value Date   GLUCOSE 107 (H) 05/19/2021   NA 140 05/19/2021   K 4.8 05/19/2021   CL 105 05/19/2021   CO2 27 05/19/2021   BUN 21 05/19/2021   CREATININE 0.61 05/19/2021   GFR 93.57 05/19/2021   CALCIUM 9.5 05/19/2021   PROT 7.2 05/19/2021   ALBUMIN 4.2 05/19/2021   BILITOT 0.5 05/19/2021   ALKPHOS 70 05/19/2021   AST 23 05/19/2021   ALT 17 05/19/2021   ANIONGAP 11 01/11/2020   Last lipids Lab Results  Component Value Date   CHOL 164 05/19/2021   HDL 71.00 05/19/2021   LDLCALC 72 05/19/2021   TRIG 103.0 05/19/2021   CHOLHDL 2 05/19/2021   Last hemoglobin A1c Lab Results  Component Value Date   HGBA1C 5.9 (A) 05/13/2021      The 10-year ASCVD risk score (Arnett DK, et al., 2019) is: 17.6%    Assessment & Plan:   Problem List Items Addressed This Visit       Unprioritized   Type 2 diabetes mellitus, controlled (HCC)   Relevant Medications   benazepril (LOTENSIN) 20 MG tablet   metFORMIN (GLUCOPHAGE) 1000 MG tablet   pravastatin (PRAVACHOL) 40 MG tablet   Other Relevant Orders   Microalbumin / creatinine urine ratio   Hemoglobin  A1c   CT CARDIAC SCORING (SELF PAY ONLY)   Hyperlipidemia   Relevant Medications   benazepril (LOTENSIN) 20 MG tablet   pravastatin (PRAVACHOL) 40 MG tablet   Other Relevant Orders   Lipid panel   CMP   CT CARDIAC SCORING (SELF PAY ONLY)   HTN (hypertension) - Primary  Relevant Medications   benazepril (LOTENSIN) 20 MG tablet   pravastatin (PRAVACHOL) 40 MG tablet  Here for medical follow-up.  Blood pressure was up slightly today but she states she rushed to get here.  We hope to see this improve further with additional weight loss.  -Obtaining multiple follow-up labs as above -Continue yearly diabetic eye exam -Consider further titration of Mounjaro to 10 mg subcutaneous once weekly in 1 month if still tolerating well -Set up 49-month medical follow-up -We discussed possible coronary calcium score to further risk stratify when she would like to proceed with that.  Return in about 3 months (around 03/19/2023).    Evelena Peat, MD

## 2022-12-20 ENCOUNTER — Telehealth: Payer: Self-pay | Admitting: Family Medicine

## 2022-12-20 MED ORDER — TIRZEPATIDE 10 MG/0.5ML ~~LOC~~ SOAJ: 10.0000 mg | SUBCUTANEOUS | 0 refills | Status: DC

## 2022-12-20 NOTE — Telephone Encounter (Signed)
Please see result note 

## 2022-12-20 NOTE — Addendum Note (Signed)
Addended by: Christy Sartorius on: 12/20/2022 03:37 PM   Modules accepted: Orders

## 2022-12-20 NOTE — Telephone Encounter (Signed)
Pt call and stated she is returning your call and want a call back. 

## 2022-12-31 ENCOUNTER — Ambulatory Visit (HOSPITAL_COMMUNITY)
Admission: RE | Admit: 2022-12-31 | Discharge: 2022-12-31 | Disposition: A | Discharge status: Home / Self Care | Payer: Managed Care, Other (non HMO) | Source: Ambulatory Visit | Attending: Family Medicine | Admitting: Family Medicine

## 2022-12-31 DIAGNOSIS — E119 Type 2 diabetes mellitus without complications: Secondary | ICD-10-CM | POA: Insufficient documentation

## 2022-12-31 DIAGNOSIS — E7800 Pure hypercholesterolemia, unspecified: Secondary | ICD-10-CM | POA: Insufficient documentation

## 2023-02-16 ENCOUNTER — Other Ambulatory Visit: Payer: Self-pay | Admitting: Family Medicine

## 2023-02-16 DIAGNOSIS — Z1231 Encounter for screening mammogram for malignant neoplasm of breast: Secondary | ICD-10-CM

## 2023-03-10 ENCOUNTER — Other Ambulatory Visit: Payer: Self-pay | Admitting: Family Medicine

## 2023-04-06 ENCOUNTER — Ambulatory Visit
Admission: RE | Admit: 2023-04-06 | Discharge: 2023-04-06 | Disposition: A | Discharge status: Home / Self Care | Payer: Managed Care, Other (non HMO) | Source: Ambulatory Visit | Attending: Family Medicine | Admitting: Family Medicine

## 2023-04-06 DIAGNOSIS — Z1231 Encounter for screening mammogram for malignant neoplasm of breast: Secondary | ICD-10-CM

## 2023-05-04 ENCOUNTER — Telehealth: Payer: Self-pay | Admitting: Family Medicine

## 2023-05-04 NOTE — Telephone Encounter (Signed)
 Copied from CRM [id number]. Topic: Referral - Request for Referral >> May 04, 2023  8:39 AM Sim Boast F wrote: Did the patient discuss referral with their provider in the last year? Yes (If No - schedule appointment) (If Yes - send message)  Appointment offered? No, patient has appointment 05/06/23  Type of order/referral and detailed reason for visit: Dermatology referral for a possible cancer spot on arm   Preference of office, provider, location: Dr. Langston Reusing phone# [phone]  If referral order, have you been seen by this specialty before? No (If Yes, this issue or another issue? When? Where?  Can we respond through MyChart? Yes

## 2023-05-06 ENCOUNTER — Ambulatory Visit: Admitting: Family Medicine

## 2023-05-06 ENCOUNTER — Encounter: Payer: Self-pay | Admitting: Family Medicine

## 2023-05-06 VITALS — BP 138/70 | HR 94 | Temp 97.9°F | Wt 213.0 lb

## 2023-05-06 DIAGNOSIS — E7800 Pure hypercholesterolemia, unspecified: Secondary | ICD-10-CM | POA: Diagnosis not present

## 2023-05-06 DIAGNOSIS — E119 Type 2 diabetes mellitus without complications: Secondary | ICD-10-CM | POA: Diagnosis not present

## 2023-05-06 DIAGNOSIS — L989 Disorder of the skin and subcutaneous tissue, unspecified: Secondary | ICD-10-CM

## 2023-05-06 DIAGNOSIS — I10 Essential (primary) hypertension: Secondary | ICD-10-CM

## 2023-05-06 LAB — POCT GLYCOSYLATED HEMOGLOBIN (HGB A1C): Hemoglobin A1C: 6.9 % — AB (ref 4.0–5.6)

## 2023-05-06 MED ORDER — TIRZEPATIDE 12.5 MG/0.5ML ~~LOC~~ SOAJ: 12.5000 mg | SUBCUTANEOUS | 3 refills | Status: AC

## 2023-05-06 NOTE — Progress Notes (Signed)
 Established Patient Office Visit  Subjective   Patient ID: Sherry Gallagher, female    DOB: [birth year]  Age: 68 y.o. MRN: [id number]  Chief Complaint  Patient presents with   Medical Management of Chronic Issues    HPI   Here for medical follow-up.  Type 2 diabetes, hypertension, hyperlipidemia.  She had recent coronary calcium score of 79 which was all basically LAD which is 75th percentile.  Most recent LDL cholesterol 84.  She does take pravastatin.  Last A1c was 7.8%.  She is now on Mounjaro 10 mg daily and A1c today improved to 6.9%.  She would like to consider further titration.  Tolerating current dose well.  Occasions reviewed and compliant with all.  Her blood pressure is treated with benazepril 20 mg daily.  Not monitoring home blood pressures regularly  New problem of scaly skin lesion left arm.  She would like to see dermatologist.  Her husband had a squamous cell recently and she states this looks very similar.  Past Medical History:  Diagnosis Date   Allergy    Asthma    seasonal - uses inhalers daily during fall   Diabetes mellitus    fasting 130s   GERD (gastroesophageal reflux disease)    Hx of adenomatous and prixmal hyperplastic polyp of colon 08/05/2022   Hyperlipidemia    on meds   Hypertension    on meds   Pneumonia    2 yrs   PONV (postoperative nausea and vomiting)    Snores    Past Surgical History:  Procedure Laterality Date   ABDOMINAL HYSTERECTOMY  1999   BREAST BIOPSY Left 11/10/2012   Procedure: BREAST BIOPSY WITH NEEDLE LOCALIZATION;  Surgeon: Emelia Loron, MD;  Location: [facility] OR;  Service: General;  Laterality: Left;   BREAST SURGERY Bilateral    breast implants   COLONOSCOPY  2014   Dr. Medoff-miralax(exc)-normal   NASAL SINUS SURGERY  1980   RADIOACTIVE SEED GUIDED EXCISIONAL BREAST BIOPSY Right 01/16/2020   Procedure: RIGHT BREAST RADIOACTIVE SEED GUIDED EXCISIONAL BREAST BIOPSY;  Surgeon: Emelia Loron, MD;  Location:  [facility];  Service: General;  Laterality: Right;   TOTAL HIP ARTHROPLASTY Left 2021    reports that she has never smoked. She has never used smokeless tobacco. She reports that she does not currently use alcohol. She reports that she does not use drugs. family history includes AAA (abdominal aortic aneurysm) (age of onset: 3) in her father; Alcohol abuse in her father; Breast cancer in her maternal grandmother and paternal grandmother; Cancer in her maternal grandmother and paternal grandmother; Throat cancer (age of onset: 2) in her mother. Allergies  Allergen Reactions   Penicillin G Hives, Itching, Swelling and Rash   Penicillins Hives, Itching, Swelling and Rash    Review of Systems  Constitutional:  Negative for malaise/fatigue.  Eyes:  Negative for blurred vision.  Respiratory:  Negative for shortness of breath.   Cardiovascular:  Negative for chest pain.  Neurological:  Negative for dizziness, weakness and headaches.      Objective:     BP 138/70 (BP Location: Left Arm, Cuff Size: Normal)   Pulse 94   Temp 97.9 F (36.6 C) (Oral)   Wt 213 lb (96.6 kg)   SpO2 96%   BMI 34.38 kg/m  BP Readings from Last 3 Encounters:  05/06/23 138/70  12/17/22 (!) 142/70  07/22/22 105/69   Wt Readings from Last 3 Encounters:  05/06/23 213 lb (96.6 kg)  12/17/22  219 lb 6.4 oz (99.5 kg)  09/15/22 225 lb (102.1 kg)      Physical Exam Vitals reviewed.  Constitutional:      General: She is not in acute distress.    Appearance: She is well-developed.  Neck:     Thyroid: No thyromegaly.     Vascular: No JVD.  Cardiovascular:     Rate and Rhythm: Normal rate and regular rhythm.     Heart sounds:     No gallop.  Pulmonary:     Effort: Pulmonary effort is normal. No respiratory distress.     Breath sounds: Normal breath sounds. No wheezing or rales.  Musculoskeletal:     Right lower leg: No edema.     Left lower leg: No edema.  Skin:    Comments: Left arm  laterally reveals approximately 5 x 5 mm raised whitish hyperkeratotic type lesion  Neurological:     Mental Status: She is alert.      Results for orders placed or performed in visit on 05/06/23  POC HgB A1c  Result Value Ref Range   Hemoglobin A1C 6.9 (A) 4.0 - 5.6 %   HbA1c POC (<> result, manual entry)     HbA1c, POC (prediabetic range)     HbA1c, POC (controlled diabetic range)      Last CBC Lab Results  Component Value Date   WBC 7.7 04/24/2019   HGB 14.0 04/24/2019   HCT 42.1 04/24/2019   MCV 92.9 04/24/2019   MCH 31.7 11/08/2012   RDW 13.3 04/24/2019   PLT 430.0 (H) 04/24/2019   Last metabolic panel Lab Results  Component Value Date   GLUCOSE 147 (H) 12/17/2022   NA 138 12/17/2022   K 4.2 12/17/2022   CL 102 12/17/2022   CO2 28 12/17/2022   BUN 16 12/17/2022   CREATININE 0.69 12/17/2022   GFR 89.83 12/17/2022   CALCIUM 10.2 12/17/2022   PROT 7.6 12/17/2022   ALBUMIN 4.4 12/17/2022   BILITOT 0.5 12/17/2022   ALKPHOS 82 12/17/2022   AST 17 12/17/2022   ALT 17 12/17/2022   ANIONGAP 11 01/11/2020   Last lipids Lab Results  Component Value Date   CHOL 167 12/17/2022   HDL 60.60 12/17/2022   LDLCALC 84 12/17/2022   TRIG 113.0 12/17/2022   CHOLHDL 3 12/17/2022   Last hemoglobin A1c Lab Results  Component Value Date   HGBA1C 6.9 (A) 05/06/2023      The 10-year ASCVD risk score (Arnett DK, et al., 2019) is: 17.7%    Assessment & Plan:   #1 type 2 diabetes improving with A1c 6.9%.  Patient currently on Mounjaro 10 mg once weekly and she would like to consider further titration.  She has had some modest weight loss thus far.  Increase Mounjaro to 12.5 mg subcutaneous once weekly.  Continue metformin.  Set up 55-month follow-up  #2 hyperlipidemia with recent coronary calcium score of 79 which was 75th percentile.  Patient currently on pravastatin.  Recent LDL cholesterol 84.  Continue with pravastatin and low saturated fat diet.  Would ideally like to  see her LDL less than 70.  Consider addition of Zetia if not improved with future lipids  #3 skin lesion left lateral arm.  Rule out early squamous cell carcinoma.  Derm referral placed per patient request  #4 hypertension.  Initial reading was slightly up today.  Improved some after rest.  Continue benazepril.  Suspect her blood pressure will come down further with additional weight loss  Return in about 3 months (around 08/06/2023).    Evelena Peat, MD

## 2023-06-16 ENCOUNTER — Ambulatory Visit: Admitting: Dermatology

## 2023-06-16 ENCOUNTER — Encounter: Payer: Self-pay | Admitting: Dermatology

## 2023-06-16 DIAGNOSIS — D485 Neoplasm of uncertain behavior of skin: Secondary | ICD-10-CM

## 2023-06-16 DIAGNOSIS — D492 Neoplasm of unspecified behavior of bone, soft tissue, and skin: Secondary | ICD-10-CM | POA: Diagnosis not present

## 2023-06-16 DIAGNOSIS — M67449 Ganglion, unspecified hand: Secondary | ICD-10-CM

## 2023-06-16 DIAGNOSIS — M67471 Ganglion, right ankle and foot: Secondary | ICD-10-CM

## 2023-06-16 DIAGNOSIS — L570 Actinic keratosis: Secondary | ICD-10-CM | POA: Diagnosis not present

## 2023-06-16 NOTE — Progress Notes (Signed)
   New Patient Visit   Subjective  Sherry Gallagher is a 68 y.o. female who presents for the following: Spot of left upper arm x ~1 year. It has never hurt or itched but it is scaly. Her husband had skin cancer on his forearm so she wanted to get it checked. She also has a spot on her right great toe that her PCP called a cyst.  The following portions of the chart were reviewed this encounter and updated as appropriate: medications, allergies, medical history  Review of Systems:  No other skin or systemic complaints except as noted in HPI or Assessment and Plan.  Objective  Well appearing patient in no apparent distress; mood and affect are within normal limits.  A focused examination was performed of the following areas:  Right foot Left arm  Relevant exam findings are noted in the Assessment and Plan.  Left Upper Arm - Anterior 0.5 cm hyperkeratotic papule  Right Hallux Proximal Dorsal Toe Cystic papule  Assessment & Plan   DIGITAL MUCOUS CYST Exam: Solitary, smooth skin colored to translucent papule on right great toe  A digital mucous cyst also known as a myxoid cyst or pseudocyst is a ganglion cyst arising from the distal interphalangeal (DIP) joint of the finger or thumb (or, less commonly, toe). The cysts are believed to form from degeneration of connective tissue and are associated with osteoarthritic joints or injury. Although the exact etiology is unknown, it is likely that a small tear forms in a joint capsule or tendon sheath, allowing extravasation of synovial fluid into the adjacent tissue. When the fluid reacts with local tissue, it becomes more gelatinous and a cyst wall forms. With any treatment, there is a high rate of recurrence.   Treatment options include: - Puncture / Incision & Drainage (I&D) - Cryosurgery - Excision / Surgery  Treatment Plan: Monitor for changes  NEOPLASM OF UNCERTAIN BEHAVIOR OF SKIN Left Upper Arm - Anterior Skin / nail  biopsy Type of biopsy: tangential   Informed consent: discussed and consent obtained   Timeout: patient name, date of birth, surgical site, and procedure verified   Procedure prep:  Patient was prepped and draped in usual sterile fashion Prep type:  Isopropyl alcohol Anesthesia: the lesion was anesthetized in a standard fashion   Anesthetic:  1% lidocaine  w/ epinephrine  1-100,000 buffered w/ 8.4% NaHCO3 Instrument used: flexible razor blade   Hemostasis achieved with: pressure, aluminum chloride and electrodesiccation   Outcome: patient tolerated procedure well   Post-procedure details: sterile dressing applied and wound care instructions given   Dressing type: bandage and petrolatum   Specimen 1 - Surgical pathology Differential Diagnosis: R/O HAK vs SCC  Check Margins: No DIGITAL MUCOUS CYST Right Hallux Proximal Dorsal Toe Benign-appearing.  Observation.  Call clinic for new or changing lesions.  Recommend daily use of broad spectrum spf 30+ sunscreen to sun-exposed areas.    Return for Pending biopsy site.  I, Eliot Guernsey, CMA, am acting as scribe for Deneise Finlay, MD .   Documentation: I have reviewed the above documentation for accuracy and completeness, and I agree with the above.  Deneise Finlay, MD

## 2023-06-16 NOTE — Patient Instructions (Signed)

## 2023-06-21 ENCOUNTER — Ambulatory Visit: Payer: Self-pay | Admitting: Dermatology

## 2023-06-21 LAB — SURGICAL PATHOLOGY

## 2023-07-11 ENCOUNTER — Ambulatory Visit: Admitting: Dermatology

## 2023-07-11 ENCOUNTER — Encounter: Payer: Self-pay | Admitting: Dermatology

## 2023-07-11 VITALS — BP 132/76 | HR 91

## 2023-07-11 DIAGNOSIS — L570 Actinic keratosis: Secondary | ICD-10-CM | POA: Diagnosis not present

## 2023-07-11 NOTE — Progress Notes (Signed)
   Follow-Up Visit   Subjective  Sherry Gallagher is a 68 y.o. female who presents for the following: AK Pt here to treat bx proven AK on left upper arm.   The following portions of the chart were reviewed this encounter and updated as appropriate: medications, allergies, medical history  Review of Systems:  No other skin or systemic complaints except as noted in HPI or Assessment and Plan.  Objective  Well appearing patient in no apparent distress; mood and affect are within normal limits.  A focused examination was performed of the following areas: Left upper arm anterior  Relevant exam findings are noted in the Assessment and Plan.  Left Upper Arm - Anterior Erythematous thin papules/macules with gritty scale.   Assessment & Plan   AK (ACTINIC KERATOSIS) Left Upper Arm - Anterior Destruction of lesion - Left Upper Arm - Anterior Complexity: simple   Destruction method: cryotherapy   Informed consent: discussed and consent obtained   Timeout:  patient name, date of birth, surgical site, and procedure verified Outcome: patient tolerated procedure well with no complications    No follow-ups on file.  I, Wilson Hasten, CMA, am acting as scribe for Deneise Finlay, MD.   Documentation: I have reviewed the above documentation for accuracy and completeness, and I agree with the above.  Deneise Finlay, MD

## 2023-07-11 NOTE — Patient Instructions (Signed)

## 2023-08-05 ENCOUNTER — Ambulatory Visit (INDEPENDENT_AMBULATORY_CARE_PROVIDER_SITE_OTHER)

## 2023-08-05 DIAGNOSIS — Z23 Encounter for immunization: Secondary | ICD-10-CM | POA: Diagnosis not present

## 2023-08-14 ENCOUNTER — Other Ambulatory Visit: Payer: Self-pay | Admitting: Family Medicine

## 2023-08-14 DIAGNOSIS — J4540 Moderate persistent asthma, uncomplicated: Secondary | ICD-10-CM

## 2023-10-06 ENCOUNTER — Ambulatory Visit (INDEPENDENT_AMBULATORY_CARE_PROVIDER_SITE_OTHER)

## 2023-10-06 DIAGNOSIS — Z23 Encounter for immunization: Secondary | ICD-10-CM

## 2023-10-23 ENCOUNTER — Other Ambulatory Visit: Payer: Self-pay | Admitting: Family Medicine

## 2023-10-23 DIAGNOSIS — J4540 Moderate persistent asthma, uncomplicated: Secondary | ICD-10-CM

## 2023-11-15 LAB — OPHTHALMOLOGY REPORT-SCANNED

## 2023-12-10 ENCOUNTER — Other Ambulatory Visit: Payer: Self-pay | Admitting: Family Medicine

## 2023-12-12 ENCOUNTER — Telehealth: Payer: Self-pay

## 2023-12-12 ENCOUNTER — Other Ambulatory Visit (HOSPITAL_COMMUNITY): Payer: Self-pay

## 2023-12-12 NOTE — Telephone Encounter (Signed)
 Pharmacy Patient Advocate Encounter   Received notification from Onbase that prior authorization for Mounjaro  12.5 is required/requested.   Insurance verification completed.   The patient is insured through [employer].   Per test claim: PA required; PA submitted to above mentioned insurance via Latent Key/confirmation #/EOC [id number] Status is pending

## 2023-12-13 ENCOUNTER — Other Ambulatory Visit: Payer: Self-pay | Admitting: Family Medicine

## 2023-12-13 DIAGNOSIS — E119 Type 2 diabetes mellitus without complications: Secondary | ICD-10-CM

## 2023-12-15 ENCOUNTER — Other Ambulatory Visit (HOSPITAL_COMMUNITY): Payer: Self-pay

## 2023-12-15 NOTE — Telephone Encounter (Signed)
 Pharmacy Patient Advocate Encounter  Received notification from [employer] that Prior Authorization for Mounjaro  12.5 has been APPROVED from 12/15/23 to 12/14/24. Ran test claim, Copay is $75.00 for 84 day supply. This test claim was processed through [facility]- copay amounts may vary at other pharmacies due to pharmacy/plan contracts, or as the patient moves through the different stages of their insurance plan.   PA #/Case ID/Reference #: # [id number]

## 2023-12-17 ENCOUNTER — Encounter (HOSPITAL_BASED_OUTPATIENT_CLINIC_OR_DEPARTMENT_OTHER): Payer: Self-pay

## 2023-12-17 ENCOUNTER — Emergency Department (HOSPITAL_BASED_OUTPATIENT_CLINIC_OR_DEPARTMENT_OTHER)
Admission: EM | Admit: 2023-12-17 | Discharge: 2023-12-17 | Disposition: A | Discharge status: Home / Self Care | Attending: Emergency Medicine | Admitting: Emergency Medicine

## 2023-12-17 ENCOUNTER — Other Ambulatory Visit: Payer: Self-pay

## 2023-12-17 DIAGNOSIS — R42 Dizziness and giddiness: Secondary | ICD-10-CM | POA: Diagnosis present

## 2023-12-17 DIAGNOSIS — E119 Type 2 diabetes mellitus without complications: Secondary | ICD-10-CM | POA: Insufficient documentation

## 2023-12-17 DIAGNOSIS — Z7984 Long term (current) use of oral hypoglycemic drugs: Secondary | ICD-10-CM | POA: Insufficient documentation

## 2023-12-17 DIAGNOSIS — Z79899 Other long term (current) drug therapy: Secondary | ICD-10-CM | POA: Diagnosis not present

## 2023-12-17 DIAGNOSIS — H81399 Other peripheral vertigo, unspecified ear: Secondary | ICD-10-CM | POA: Diagnosis not present

## 2023-12-17 DIAGNOSIS — Z7982 Long term (current) use of aspirin: Secondary | ICD-10-CM | POA: Diagnosis not present

## 2023-12-17 DIAGNOSIS — I10 Essential (primary) hypertension: Secondary | ICD-10-CM | POA: Diagnosis not present

## 2023-12-17 DIAGNOSIS — J45909 Unspecified asthma, uncomplicated: Secondary | ICD-10-CM | POA: Diagnosis not present

## 2023-12-17 LAB — CBC WITH DIFFERENTIAL/PLATELET
Abs Immature Granulocytes: 0.04 K/uL (ref 0.00–0.07)
Basophils Absolute: 0.1 K/uL (ref 0.0–0.1)
Basophils Relative: 1 %
Eosinophils Absolute: 0.2 K/uL (ref 0.0–0.5)
Eosinophils Relative: 2 %
HCT: 42.6 % (ref 36.0–46.0)
Hemoglobin: 14.2 g/dL (ref 12.0–15.0)
Immature Granulocytes: 0 %
Lymphocytes Relative: 12 %
Lymphs Abs: 1.5 K/uL (ref 0.7–4.0)
MCH: 30.7 pg (ref 26.0–34.0)
MCHC: 33.3 g/dL (ref 30.0–36.0)
MCV: 92.2 fL (ref 80.0–100.0)
Monocytes Absolute: 0.6 K/uL (ref 0.1–1.0)
Monocytes Relative: 5 %
Neutro Abs: 9.8 K/uL — ABNORMAL HIGH (ref 1.7–7.7)
Neutrophils Relative %: 80 %
Platelets: 432 K/uL — ABNORMAL HIGH (ref 150–400)
RBC: 4.62 MIL/uL (ref 3.87–5.11)
RDW: 13.3 % (ref 11.5–15.5)
WBC: 12.3 K/uL — ABNORMAL HIGH (ref 4.0–10.5)
nRBC: 0 % (ref 0.0–0.2)

## 2023-12-17 LAB — CBG MONITORING, ED: Glucose-Capillary: 134 mg/dL — ABNORMAL HIGH (ref 70–99)

## 2023-12-17 LAB — BASIC METABOLIC PANEL WITH GFR
Anion gap: 12 (ref 5–15)
BUN: 16 mg/dL (ref 8–23)
CO2: 26 mmol/L (ref 22–32)
Calcium: 10.2 mg/dL (ref 8.9–10.3)
Chloride: 102 mmol/L (ref 98–111)
Creatinine, Ser: 0.78 mg/dL (ref 0.44–1.00)
GFR, Estimated: >60 mL/min (ref >60)
Glucose, Bld: 128 mg/dL — ABNORMAL HIGH (ref 70–99)
Potassium: 4.3 mmol/L (ref 3.5–5.1)
Sodium: 140 mmol/L (ref 135–145)

## 2023-12-17 LAB — MAGNESIUM: Magnesium: 2.3 mg/dL (ref 1.7–2.4)

## 2023-12-17 MED ORDER — SODIUM CHLORIDE 0.9 % IV BOLUS
1000.0000 mL | Freq: Once | INTRAVENOUS | Status: AC
  Administered 2023-12-17: 1000 mL via INTRAVENOUS

## 2023-12-17 MED ORDER — MECLIZINE HCL 25 MG PO TABS: 25.0000 mg | ORAL_TABLET | Freq: Two times a day (BID) | ORAL | 0 refills | Status: AC | PRN

## 2023-12-17 MED ORDER — ONDANSETRON HCL 4 MG/2ML IJ SOLN
4.0000 mg | Freq: Once | INTRAMUSCULAR | Status: AC
  Administered 2023-12-17: 4 mg via INTRAVENOUS
  Filled 2023-12-17: qty 2

## 2023-12-17 MED ORDER — MECLIZINE HCL 25 MG PO TABS
25.0000 mg | ORAL_TABLET | Freq: Once | ORAL | Status: AC
  Administered 2023-12-17: 25 mg via ORAL
  Filled 2023-12-17: qty 1

## 2023-12-17 MED ORDER — ONDANSETRON HCL 4 MG PO TABS: 4.0000 mg | ORAL_TABLET | Freq: Three times a day (TID) | ORAL | 0 refills | Status: AC | PRN

## 2023-12-17 MED ORDER — OXYMETAZOLINE HCL 0.05 % NA SOLN: 1.0000 | Freq: Two times a day (BID) | NASAL | 0 refills | Status: AC

## 2023-12-17 NOTE — Discharge Instructions (Addendum)
 It was a pleasure caring for you today in the emergency department.  Please follow up with your pcp in the office for recheck. Be sure to move carefully if you are experiencing symptoms of dizziness to avoid falling.   Please return to the emergency department for any worsening or worrisome symptoms including but not limited to severe headache, severe dizziness, vomiting, weakness/numbness to your arms/legs, vision changes etc

## 2023-12-17 NOTE — ED Triage Notes (Signed)
 Lightheaded and dizziness with nausea/vomiting. Started at 1030 this am. Stuffy/full/muffled feeling ears all week. Denies visual disturbances or slurred speech. Moves all extremities well.

## 2023-12-17 NOTE — ED Notes (Addendum)
 IV infiltrated shortly after the administration of IV fluids. Patient states she does not want to be restuck at this time and does not need the rest of the IV fluids.

## 2023-12-17 NOTE — ED Notes (Signed)
 EDP Dr. Elnor at bedside.

## 2023-12-17 NOTE — ED Notes (Signed)
 Reviewed AVS/discharge instructions with patient. Time allotted for and all questions answered. Patient is agreeable for d/c and escorted to ED exit by staff.

## 2023-12-17 NOTE — ED Notes (Signed)
 Patient says she feels much better after the meclizine and feels ready to go home. Message sent to provider relaying same.

## 2023-12-17 NOTE — ED Provider Notes (Signed)
 [place] EMERGENCY DEPARTMENT AT [facility] Provider Note  CSN: [id number] Arrival date & time: 12/17/23 1151  Chief Complaint(s) Dizziness  HPI Sherry Gallagher is a 68 y.o. female with past medical history as below, significant for GERD, HLD, HTN, type II DM who presents to the ED with complaint of dizziness.  Patient reports sudden onset spinning sensation while she was performing yard work.  Sensation is worsened with head movement or attempting to ambulate.  She feels as though the room is spinning around her.  She feels very nauseated.  Does report recent sinus pressure and congestion.  No headache.  No numbness, weakness or tingling to her extremities.  No vision changes.  Denies similar symptoms in the past.  This began around 10 AM this morning.  Not anticoagulated.  Past Medical History Past Medical History:  Diagnosis Date   Allergy    Asthma    seasonal - uses inhalers daily during fall   Diabetes mellitus    fasting 130s   GERD (gastroesophageal reflux disease)    Hx of adenomatous and prixmal hyperplastic polyp of colon 08/05/2022   Hyperlipidemia    on meds   Hypertension    on meds   Pneumonia    2 yrs   PONV (postoperative nausea and vomiting)    Snores    Patient Active Problem List   Diagnosis Date Noted   Hx of adenomatous and prixmal hyperplastic polyp of colon 08/05/2022   Adjustment disorder with mixed anxiety and depressed mood 12/12/2018   Gout 05/31/2016   Type 2 diabetes mellitus, controlled (HCC) 09/05/2013   Obesity (BMI 30-39.9) 09/05/2013   Seasonal allergic rhinitis 06/17/2011   Dysuria 03/16/2011   Asthma, moderate persistent 01/26/2011   DM (diabetes mellitus) (HCC) 10/14/2010   HTN (hypertension) 10/14/2010   Hyperlipidemia 10/14/2010   Home Medication(s) Prior to Admission medications   Medication Sig Start Date End Date Taking? Authorizing Provider  meclizine (ANTIVERT) 25 MG tablet Take 1 tablet (25 mg total) by  mouth 2 (two) times daily as needed for dizziness. 12/17/23  Yes Elnor Savant A, DO  ondansetron  (ZOFRAN ) 4 MG tablet Take 1 tablet (4 mg total) by mouth every 8 (eight) hours as needed for nausea or vomiting. 12/17/23  Yes Elnor Savant A, DO  oxymetazoline (AFRIN NASAL SPRAY) 0.05 % nasal spray Place 1 spray into both nostrils 2 (two) times daily for 3 days. 12/17/23 12/20/23 Yes Elnor Savant LABOR, DO  albuterol  (VENTOLIN  HFA) 108 (90 Base) MCG/ACT inhaler Inhale 2 puffs into the lungs every 6 (six) hours as needed for wheezing or shortness of breath. 12/17/22   Burchette, Wolm ORN, MD  aspirin  EC 81 MG tablet Take 81 mg by mouth every morning.    [provider]  benazepril  (LOTENSIN ) 20 MG tablet TAKE 1 TABLET(20 MG) BY MOUTH DAILY 12/17/22   Burchette, Wolm ORN, MD  cetirizine (ZYRTEC) 10 MG tablet Take 10 mg by mouth every other day.    [provider]  COMFORT LANCETS MISC Use as directed.  Ultra Soft Touch Lancets 01/27/16   Burchette, Wolm ORN, MD  esomeprazole  (NEXIUM ) 40 MG capsule Take 40 mg by mouth every other day.    [provider]  fluticasone  (FLONASE ) 50 MCG/ACT nasal spray Place 2 sprays into the nose as needed.     [provider]  fluticasone -salmeterol (ADVAIR) 250-50 MCG/ACT AEPB INHALE 1 PUFF BY MOUTH DAILY AS NEEDED 10/24/23   Burchette, Wolm ORN, MD  glucose blood (  ONE TOUCH ULTRA TEST) test strip Use to check blood sugar every day. DX: E11.9 07/29/20   Burchette, Wolm ORN, MD  metFORMIN  (GLUCOPHAGE ) 1000 MG tablet TAKE 1 TABLET BY MOUTH EVERY MORNING AND EVERY EVENING 12/13/23   Burchette, Wolm ORN, MD  Multiple Vitamin (MULTIVITAMIN) tablet Take 1 tablet by mouth daily.    [provider]  Omega-3 Fatty Acids (EQL OMEGA 3 FISH OIL PO) Take 1 capsule by mouth daily at 6 (six) AM.    [provider]  polyethylene glycol (MIRALAX / GLYCOLAX) 17 g packet Take 1 packet by mouth daily as needed for moderate constipation, mild constipation or  severe constipation. 05/08/19   [provider]  pravastatin  (PRAVACHOL ) 40 MG tablet TAKE 1 TABLET(40 MG) BY MOUTH DAILY 12/12/23   Burchette, Wolm ORN, MD  tirzepatide  (MOUNJARO ) 12.5 MG/0.5ML Pen Inject 12.5 mg into the skin once a week. 05/06/23   Burchette, Wolm ORN, MD  VITAMIN D, CHOLECALCIFEROL, PO Take 1 tablet by mouth every evening.    [provider]                                                                                                                                    Past Surgical History Past Surgical History:  Procedure Laterality Date   ABDOMINAL HYSTERECTOMY  1999   BREAST BIOPSY Left 11/10/2012   Procedure: BREAST BIOPSY WITH NEEDLE LOCALIZATION;  Surgeon: Donnice Bury, MD;  Location: [facility] OR;  Service: General;  Laterality: Left;   BREAST SURGERY Bilateral    breast implants   COLONOSCOPY  2014   Dr. Medoff-miralax(exc)-normal   NASAL SINUS SURGERY  1980   RADIOACTIVE SEED GUIDED EXCISIONAL BREAST BIOPSY Right 01/16/2020   Procedure: RIGHT BREAST RADIOACTIVE SEED GUIDED EXCISIONAL BREAST BIOPSY;  Surgeon: Bury Donnice, MD;  Location: [facility];  Service: General;  Laterality: Right;   TOTAL HIP ARTHROPLASTY Left 2021   Family History Family History  Problem Relation Age of Onset   Throat cancer Mother 90       smoker/ETOH   Alcohol abuse Father    AAA (abdominal aortic aneurysm) Father 53   Cancer Maternal Grandmother        breast   Breast cancer Maternal Grandmother    Cancer Paternal Grandmother        breast   Breast cancer Paternal Grandmother    Colon polyps Neg Hx    Colon cancer Neg Hx    Esophageal cancer Neg Hx    Rectal cancer Neg Hx    Stomach cancer Neg Hx     Social History Social History   Tobacco Use   Smoking status: Never   Smokeless tobacco: Never  Vaping Use   Vaping status: Never Used  Substance Use Topics   Alcohol use: Not Currently    Comment: social   Drug use: No    Allergies Penicillin g and Penicillins  Review of Systems A thorough review of systems was obtained and all systems are negative except as noted in the HPI and PMH.   Physical Exam Vital Signs  I have reviewed the triage vital signs BP 116/73   Pulse 88   Temp 97.9 F (36.6 C) (Oral)   Resp 18   Ht 5' 6 (1.676 m)   Wt 85.7 kg   SpO2 100%   BMI 30.51 kg/m  Physical Exam Vitals and nursing note reviewed.  Constitutional:      General: She is not in acute distress.    Appearance: Normal appearance. She is well-developed. She is not ill-appearing.  HENT:     Head: Normocephalic and atraumatic.     Right Ear: Tympanic membrane and external ear normal. No middle ear effusion.     Left Ear: External ear normal. A middle ear effusion is present.     Nose: Nose normal.     Mouth/Throat:     Mouth: Mucous membranes are moist.  Eyes:     General: No scleral icterus.       Right eye: No discharge.        Left eye: No discharge.     Extraocular Movements: Extraocular movements intact.     Pupils: Pupils are equal, round, and reactive to light.     Comments: Fatigable horizontal nystagmus to the left  Cardiovascular:     Rate and Rhythm: Normal rate.  Pulmonary:     Effort: Pulmonary effort is normal. No respiratory distress.     Breath sounds: No stridor.  Abdominal:     General: Abdomen is flat. There is no distension.     Tenderness: There is no guarding.  Musculoskeletal:        General: No deformity.     Cervical back: No rigidity.  Skin:    General: Skin is warm and dry.     Coloration: Skin is not cyanotic, jaundiced or pale.  Neurological:     Mental Status: She is alert and oriented to person, place, and time.     GCS: GCS eye subscore is 4. GCS verbal subscore is 5. GCS motor subscore is 6.     Cranial Nerves: Cranial nerves 2-12 are intact. No dysarthria or facial asymmetry.     Sensory: Sensation is intact. No sensory deficit.     Motor: Motor function is  intact. No weakness, tremor or pronator drift.     Coordination: Coordination is intact. Coordination normal. Finger-Nose-Finger Test normal.     Comments: Gait testing deferred secondary to patient safety.   Psychiatric:        Speech: Speech normal.        Behavior: Behavior normal. Behavior is cooperative.     ED Results and Treatments Labs (all labs ordered are listed, but only abnormal results are displayed) Labs Reviewed  CBC WITH DIFFERENTIAL/PLATELET - Abnormal; Notable for the following components:      Result Value   WBC 12.3 (*)    Platelets 432 (*)    Neutro Abs 9.8 (*)    All other components within normal limits  BASIC METABOLIC PANEL WITH GFR - Abnormal; Notable for the following components:   Glucose, Bld 128 (*)    All other components within normal limits  CBG MONITORING, ED - Abnormal; Notable for the following components:   Glucose-Capillary 134 (*)    All other components within normal limits  MAGNESIUM   Radiology No results found.  Pertinent labs & imaging results that were available during my care of the patient were reviewed by me and considered in my medical decision making (see MDM for details).  Medications Ordered in ED Medications  meclizine (ANTIVERT) tablet 25 mg (25 mg Oral Given 12/17/23 1316)  ondansetron  (ZOFRAN ) injection 4 mg (4 mg Intravenous Given 12/17/23 1310)  sodium chloride  0.9 % bolus 1,000 mL (0 mLs Intravenous Stopped 12/17/23 1319)                                                                                                                                     Procedures Procedures  (including critical care time)  Medical Decision Making / ED Course    Medical Decision Making:    MARGRETE DELUDE is a 68 y.o. female with past medical history as below, significant for GERD, HLD, HTN, type II DM who  presents to the ED with complaint of dizziness. The complaint involves an extensive differential diagnosis and also carries with it a high risk of complications and morbidity.  Serious etiology was considered. Ddx includes but is not limited to: Peripheral vertigo, central vertigo, CVA, electrolyte derangement, medication effect, Mnire's disease, disequilibrium, etc.  Complete initial physical exam performed, notably the patient was in no acute distress.    Reviewed and confirmed nursing documentation for past medical history, family history, social history.  Vital signs reviewed.    Dizziness Peripheral vertigo > - Patient with sudden onset dizziness, spinning sensation this morning.  Provoked by head turning or movement.  Nausea. - Neuroexam is intact, no focal deficits. - exam c/w peripheral vertigo, neuro intact - pt also with complaint of sinus congestion, slight middle ear effusion noted on exam, no evidence of bacterial infection at this time. Rx decongestant, supportive care for home  - symptoms resolved, she is ambulatory w/ steady gait, tolerating po, requesting DC  Clinical Course as of 12/17/23 1546  Sat Dec 17, 2023  1421 Symptoms resolved [SG]    Clinical Course User Index [SG] Elnor Jayson LABOR, DO     I have discussed the diagnosis/risks/treatment options with the patient/family.  Evaluation and diagnostic testing in the emergency department does not suggest an emergent condition requiring admission or immediate intervention beyond what has been performed at this time.  They will follow up with pcp. We also discussed returning to the ED immediately if new or worsening sx occur. We discussed the sx which are most concerning (e.g., sudden worsening pain, fever, inability to tolerate by mouth , unilateral weakness/sensation change, ha, gait change) that necessitate immediate return.    The patient appears reasonably screened and/or stabilized for discharge and I doubt any other  medical condition or other [facility] requiring further screening, evaluation, or treatment in the ED at this time prior to discharge.  Additional history obtained: -Additional history obtained from family -External records from outside source obtained and reviewed including: Chart review including previous notes, labs, imaging, consultation notes including  Allergy list Home meds   Lab Tests: -I ordered, reviewed, and interpreted labs.   The pertinent results include:   Labs Reviewed  CBC WITH DIFFERENTIAL/PLATELET - Abnormal; Notable for the following components:      Result Value   WBC 12.3 (*)    Platelets 432 (*)    Neutro Abs 9.8 (*)    All other components within normal limits  BASIC METABOLIC PANEL WITH GFR - Abnormal; Notable for the following components:   Glucose, Bld 128 (*)    All other components within normal limits  CBG MONITORING, ED - Abnormal; Notable for the following components:   Glucose-Capillary 134 (*)    All other components within normal limits  MAGNESIUM     Notable for labs stable  EKG   EKG Interpretation Date/Time:  Saturday December 17 2023 12:01:08 EST Ventricular Rate:  89 PR Interval:  180 QRS Duration:  66 QT Interval:  358 QTC Calculation: 435 R Axis:   29  Text Interpretation: Normal sinus rhythm Cannot rule out Anterior infarct , age undetermined Abnormal ECG When compared with ECG of 08-Nov-2012 09:46, No significant change was found similar to prior no stemi Confirmed by Elnor Savant ([id number]) on 12/17/2023 12:20:59 PM         Imaging Studies ordered: na   Medicines ordered and prescription drug management: Meds ordered this encounter  Medications   meclizine (ANTIVERT) tablet 25 mg   ondansetron  (ZOFRAN ) injection 4 mg   sodium chloride  0.9 % bolus 1,000 mL   meclizine (ANTIVERT) 25 MG tablet    Sig: Take 1 tablet (25 mg total) by mouth 2 (two) times daily as needed for dizziness.    Dispense:  15  tablet    Refill:  0   ondansetron  (ZOFRAN ) 4 MG tablet    Sig: Take 1 tablet (4 mg total) by mouth every 8 (eight) hours as needed for nausea or vomiting.    Dispense:  5 tablet    Refill:  0   oxymetazoline (AFRIN NASAL SPRAY) 0.05 % nasal spray    Sig: Place 1 spray into both nostrils 2 (two) times daily for 3 days.    Dispense:  15 mL    Refill:  0    -I have reviewed the patients home medicines and have made adjustments as needed   Consultations Obtained: na   Cardiac Monitoring: Continuous pulse oximetry interpreted by myself, 99% on ra.    Social Determinants of Health:  Diagnosis or treatment significantly limited by social determinants of health: obesity   Reevaluation: After the interventions noted above, I reevaluated the patient and found that they have resolved  Co morbidities that complicate the patient evaluation  Past Medical History:  Diagnosis Date   Allergy    Asthma    seasonal - uses inhalers daily during fall   Diabetes mellitus    fasting 130s   GERD (gastroesophageal reflux disease)    Hx of adenomatous and prixmal hyperplastic polyp of colon 08/05/2022   Hyperlipidemia    on meds   Hypertension    on meds   Pneumonia    2 yrs   PONV (postoperative nausea and vomiting)    Snores       Dispostion: Disposition decision including need for hospitalization was considered, and patient discharged from emergency department.  Final Clinical Impression(s) / ED Diagnoses Final diagnoses:  Peripheral vertigo, unspecified laterality        Elnor Jayson LABOR, DO 12/17/23 1546

## 2024-01-07 ENCOUNTER — Other Ambulatory Visit: Payer: Self-pay | Admitting: Family Medicine

## 2024-01-24 ENCOUNTER — Encounter: Payer: Self-pay | Admitting: Family Medicine

## 2024-01-24 ENCOUNTER — Ambulatory Visit: Admitting: Family Medicine

## 2024-01-24 ENCOUNTER — Ambulatory Visit: Payer: Self-pay | Admitting: *Deleted

## 2024-01-24 VITALS — BP 140/66 | HR 92 | Temp 97.3°F | Wt 182.6 lb

## 2024-01-24 DIAGNOSIS — R300 Dysuria: Secondary | ICD-10-CM

## 2024-01-24 DIAGNOSIS — E119 Type 2 diabetes mellitus without complications: Secondary | ICD-10-CM | POA: Diagnosis not present

## 2024-01-24 DIAGNOSIS — I10 Essential (primary) hypertension: Secondary | ICD-10-CM

## 2024-01-24 DIAGNOSIS — E7800 Pure hypercholesterolemia, unspecified: Secondary | ICD-10-CM

## 2024-01-24 DIAGNOSIS — Z7985 Long-term (current) use of injectable non-insulin antidiabetic drugs: Secondary | ICD-10-CM | POA: Diagnosis not present

## 2024-01-24 LAB — POC URINALSYSI DIPSTICK (AUTOMATED)
Bilirubin, UA: NEGATIVE
Blood, UA: NEGATIVE
Glucose, UA: NEGATIVE
Ketones, UA: NEGATIVE
Leukocytes, UA: NEGATIVE
Nitrite, UA: NEGATIVE
Protein, UA: NEGATIVE
Spec Grav, UA: 1.015 (ref 1.010–1.025)
Urobilinogen, UA: 0.2 U/dL
pH, UA: 6 (ref 5.0–8.0)

## 2024-01-24 LAB — POCT GLYCOSYLATED HEMOGLOBIN (HGB A1C): Hemoglobin A1C: 5.9 % — AB (ref 4.0–5.6)

## 2024-01-24 MED ORDER — NITROFURANTOIN MONOHYD MACRO 100 MG PO CAPS: 100.0000 mg | ORAL_CAPSULE | Freq: Two times a day (BID) | ORAL | 0 refills | Status: AC

## 2024-01-24 NOTE — Progress Notes (Signed)
 Established Patient Office Visit  Subjective   Patient ID: Sherry Gallagher, female    DOB: 02/24/1955  Age: 68 y.o. MRN: [id number]  Chief Complaint  Patient presents with   Urinary Tract Infection    HPI   Sherry Gallagher was seen today for possible UTI.  She states she had onset Sunday of some urine frequency and burning with urination.  Last night her symptoms had progressed and she had some leftover nitrofurantoin  and actually took 1 and then a second 1 this morning before going to work.  Has felt somewhat better since taking that.  Denies any flank pain.  No nausea or vomiting.  No documented fever.  She has had episodes in the past with burning with subsequent negative culture.  Type 2 diabetes.  Has been on Mounjaro  for almost a year now.  Currently on 12.5 mg.  She has lost significant amount of weight from 213 pounds back in March to current weight of 182 pounds.  Feels better overall.  Not monitoring blood sugars regularly.  Last A1c was 6.9%.  She had A1c 7.8% about a year ago at this time.  He has hypertension treated with benazepril  20 mg daily.  Hyperlipidemia treated with pravastatin .  She is nonfasting today.  Is due for follow-up lipids  Past Medical History:  Diagnosis Date   Allergy    Asthma    seasonal - uses inhalers daily during fall   Diabetes mellitus    fasting 130s   GERD (gastroesophageal reflux disease)    Hx of adenomatous and prixmal hyperplastic polyp of colon 08/05/2022   Hyperlipidemia    on meds   Hypertension    on meds   Pneumonia    2 yrs   PONV (postoperative nausea and vomiting)    Snores    Past Surgical History:  Procedure Laterality Date   ABDOMINAL HYSTERECTOMY  1999   BREAST BIOPSY Left 11/10/2012   Procedure: BREAST BIOPSY WITH NEEDLE LOCALIZATION;  Surgeon: Donnice Bury, MD;  Location: [facility] OR;  Service: General;  Laterality: Left;   BREAST SURGERY Bilateral    breast implants   COLONOSCOPY  2014   Dr.  Medoff-miralax(exc)-normal   NASAL SINUS SURGERY  1980   RADIOACTIVE SEED GUIDED EXCISIONAL BREAST BIOPSY Right 01/16/2020   Procedure: RIGHT BREAST RADIOACTIVE SEED GUIDED EXCISIONAL BREAST BIOPSY;  Surgeon: Bury Donnice, MD;  Location: [facility];  Service: General;  Laterality: Right;   TOTAL HIP ARTHROPLASTY Left 2021    reports that she has never smoked. She has never used smokeless tobacco. She reports that she does not currently use alcohol. She reports that she does not use drugs. family history includes AAA (abdominal aortic aneurysm) (age of onset: 78) in her father; Alcohol abuse in her father; Breast cancer in her maternal grandmother and paternal grandmother; Cancer in her maternal grandmother and paternal grandmother; Throat cancer (age of onset: 66) in her mother. Allergies[1]  Review of Systems  Constitutional:  Negative for malaise/fatigue.  Eyes:  Negative for blurred vision.  Respiratory:  Negative for shortness of breath.   Cardiovascular:  Negative for chest pain.  Gastrointestinal:  Negative for nausea and vomiting.  Genitourinary:  Positive for dysuria and frequency. Negative for flank pain and hematuria.  Neurological:  Negative for dizziness, weakness and headaches.      Objective:     BP (!) 140/66   Pulse 92   Temp (!) 97.3 F (36.3 C) (Oral)   Wt 182 lb  9.6 oz (82.8 kg)   SpO2 98%   BMI 29.47 kg/m  BP Readings from Last 3 Encounters:  01/24/24 (!) 140/66  12/17/23 116/73  07/11/23 132/76   Wt Readings from Last 3 Encounters:  01/24/24 182 lb 9.6 oz (82.8 kg)  12/17/23 189 lb (85.7 kg)  05/06/23 213 lb (96.6 kg)      Physical Exam Vitals reviewed.  Constitutional:      General: She is not in acute distress.    Appearance: She is not ill-appearing.  Cardiovascular:     Rate and Rhythm: Normal rate and regular rhythm.  Pulmonary:     Effort: Pulmonary effort is normal.     Breath sounds: Normal breath sounds. No  wheezing or rales.  Neurological:     Mental Status: She is alert.      Results for orders placed or performed in visit on 01/24/24  POCT Urinalysis Dipstick (Automated)  Result Value Ref Range   Color, UA yellow    Clarity, UA clear    Glucose, UA Negative Negative   Bilirubin, UA negative    Ketones, UA negative    Spec Grav, UA 1.015 1.010 - 1.025   Blood, UA negative    pH, UA 6.0 5.0 - 8.0   Protein, UA Negative Negative   Urobilinogen, UA 0.2 0.2 or 1.0 E.U./dL   Nitrite, UA negative    Leukocytes, UA Negative Negative  POC HgB A1c  Result Value Ref Range   Hemoglobin A1C 5.9 (A) 4.0 - 5.6 %   HbA1c POC (<> result, manual entry)     HbA1c, POC (prediabetic range)     HbA1c, POC (controlled diabetic range)        The 10-year ASCVD risk score (Arnett DK, et al., 2019) is: 20.2%    Assessment & Plan:   #1 dysuria.  Patient had classic UTI type symptoms yesterday and started Macrobid  leftover from prior prescription.  Her urine dipstick today looks clear but she has already been on 1 day of treatment.  We agreed to go and send in Macrobid  1 twice daily for 5 more days and stay well-hydrated.  Follow-up for any recurrent symptoms  #2 type 2 diabetes improved on Mounjaro .  A1c today 5.9%.  Continue current regimen.  Set up routine medical follow-up in about 4 to 6 months.  Check urine microalbumin screen at follow-up  #3 hypertension treated with benazepril .  Blood pressure should continue to improve with weight loss.  Continue low-sodium diet.  #4 hyperlipidemia treated with pravastatin .  Patient nonfasting today.  Check fasting lipids and CMP at follow-up in few months   No follow-ups on file.    Wolm Scarlet, MD     [1]  Allergies Allergen Reactions   Penicillin G Hives, Itching, Swelling and Rash   Penicillins Hives, Itching, Swelling and Rash

## 2024-01-24 NOTE — Telephone Encounter (Signed)
 FYI Only or Action Required?: Action required by provider: request Rx.  Patient was last seen in primary care on 05/06/2023 by Sherry Gallagher ORN, MD.  Called Nurse Triage reporting Dysuria (frequency).  Symptoms began several days ago.  Interventions attempted: Prescription medications: Macrobid - 2 tablets.  Symptoms are: did improve symptoms- not complete.  Triage Disposition: See Physician Within 24 Hours  Patient/caregiver understands and will follow disposition?: No, wishes to speak with PCP   Copied from CRM #[id number]. Topic: Clinical - Red Word Triage >> Jan 24, 2024  7:51 AM Sherry Gallagher wrote: Red Word that prompted transfer to Nurse Triage: UTI Symptoms(started taking medication from 3 years to help resolved); painful urination, urine frequency(just a little pee each time). Reason for Disposition  Age > 50 years  Answer Assessment - Initial Assessment Questions Patient is requesting Rx for UTI- patient states it has been years since she had UTI- but she started having symptoms Sunday- patient states PCP has treated for this but it has been years. Patient has 2 Macrobid  pills and has taken them. Advised patient of office protocol regarding UTI symptoms- she is aware- but since she is getting relief with taking the 2 pills would like to see if provider would call this in. Patient uses [facility]. Patient adivsed I would send request.    1. SEVERITY: How bad is the pain?  (e.g., Scale 1-10; mild, moderate, or severe)     Better today-8/10 yesterday 2. FREQUENCY: How many times have you had painful urination today?      4/10 today- 3-4 times- discomfort 3. PATTERN: Is pain present every time you urinate or just sometimes?      Every time 4. ONSET: When did the painful urination start?      Sunday 5. FEVER: Do you have a fever? If Yes, ask: What is your temperature, how was it measured, and when did it start?     no 6. PAST UTI: Have you had a urine infection  before? If Yes, ask: When was the last time? and What happened that time?      Yes- patient was given antibiotic 01/2021 she had 2 left and took them 7. CAUSE: What do you think is causing the painful urination?  (e.g., UTI, scratch, Herpes sore)     UTI 8. OTHER SYMPTOMS: Do you have any other symptoms? (e.g., blood in urine, flank pain, genital sores, urgency, vaginal discharge)     frequency  Protocols used: Urination Pain - Female-A-AH

## 2024-01-24 NOTE — Patient Instructions (Signed)
 A1c today is 5.9%

## 2024-02-13 ENCOUNTER — Other Ambulatory Visit: Payer: Self-pay | Admitting: Family Medicine

## 2024-02-21 ENCOUNTER — Other Ambulatory Visit: Payer: Self-pay | Admitting: Family Medicine

## 2024-02-21 DIAGNOSIS — Z1231 Encounter for screening mammogram for malignant neoplasm of breast: Secondary | ICD-10-CM

## 2024-04-06 ENCOUNTER — Ambulatory Visit
